# Patient Record
Sex: Female | Born: 1955
Health system: Southern US, Community
[De-identification: ages and names within clinical notes are randomized; demographics above are authoritative.]

## PROBLEM LIST (undated history)

## (undated) DIAGNOSIS — M549 Dorsalgia, unspecified: Secondary | ICD-10-CM

## (undated) DIAGNOSIS — M1990 Unspecified osteoarthritis, unspecified site: Secondary | ICD-10-CM

## (undated) DIAGNOSIS — K8050 Calculus of bile duct without cholangitis or cholecystitis without obstruction: Secondary | ICD-10-CM

## (undated) DIAGNOSIS — K859 Acute pancreatitis without necrosis or infection, unspecified: Secondary | ICD-10-CM

## (undated) DIAGNOSIS — I10 Essential (primary) hypertension: Secondary | ICD-10-CM

## (undated) DIAGNOSIS — E039 Hypothyroidism, unspecified: Secondary | ICD-10-CM

## (undated) HISTORY — PX: APPENDECTOMY: SHX54

## (undated) HISTORY — DX: Dorsalgia, unspecified: M54.9

## (undated) HISTORY — PX: ABDOMINAL HYSTERECTOMY: SHX81

## (undated) HISTORY — PX: TUBAL LIGATION: SHX77

## (undated) HISTORY — PX: HERNIA REPAIR: SHX51

## (undated) HISTORY — DX: Unspecified osteoarthritis, unspecified site: M19.90

## (undated) HISTORY — DX: Hypothyroidism, unspecified: E03.9

## (undated) HISTORY — PX: ERCP: SHX60

## (undated) HISTORY — PX: OTHER SURGICAL HISTORY: SHX169

---

## 2001-04-25 ENCOUNTER — Ambulatory Visit (HOSPITAL_COMMUNITY): Admission: RE | Admit: 2001-04-25 | Discharge: 2001-04-25 | Payer: Self-pay | Admitting: Family Medicine

## 2001-04-25 ENCOUNTER — Encounter: Payer: Self-pay | Admitting: Family Medicine

## 2005-04-30 ENCOUNTER — Emergency Department (HOSPITAL_COMMUNITY): Admission: EM | Admit: 2005-04-30 | Discharge: 2005-04-30 | Payer: Self-pay | Admitting: Emergency Medicine

## 2006-10-10 ENCOUNTER — Ambulatory Visit (HOSPITAL_COMMUNITY): Admission: RE | Admit: 2006-10-10 | Discharge: 2006-10-10 | Payer: Self-pay | Admitting: Family Medicine

## 2009-12-19 ENCOUNTER — Emergency Department (HOSPITAL_COMMUNITY): Admission: EM | Admit: 2009-12-19 | Discharge: 2009-12-19 | Payer: Self-pay | Admitting: Emergency Medicine

## 2010-01-03 ENCOUNTER — Ambulatory Visit (HOSPITAL_COMMUNITY)
Admission: RE | Admit: 2010-01-03 | Discharge: 2010-01-03 | Payer: Self-pay | Source: Home / Self Care | Admitting: Family Medicine

## 2011-01-10 ENCOUNTER — Encounter: Payer: Self-pay | Admitting: Family Medicine

## 2011-01-11 ENCOUNTER — Ambulatory Visit (HOSPITAL_COMMUNITY)
Admission: RE | Admit: 2011-01-11 | Discharge: 2011-01-11 | Payer: Self-pay | Source: Home / Self Care | Attending: Family Medicine | Admitting: Family Medicine

## 2011-01-20 ENCOUNTER — Encounter: Payer: Self-pay | Admitting: Family Medicine

## 2011-06-22 ENCOUNTER — Ambulatory Visit (HOSPITAL_COMMUNITY)
Admission: RE | Admit: 2011-06-22 | Discharge: 2011-06-22 | Disposition: A | Discharge status: Home / Self Care | Payer: BC Managed Care – PPO | Source: Ambulatory Visit | Attending: Family Medicine | Admitting: Family Medicine

## 2011-06-22 ENCOUNTER — Other Ambulatory Visit (HOSPITAL_COMMUNITY): Payer: Self-pay | Admitting: Family Medicine

## 2011-06-22 DIAGNOSIS — M25569 Pain in unspecified knee: Secondary | ICD-10-CM

## 2011-06-22 DIAGNOSIS — IMO0002 Reserved for concepts with insufficient information to code with codable children: Secondary | ICD-10-CM

## 2011-09-17 SURGERY — Surgical Case
Anesthesia: *Unknown

## 2012-03-08 ENCOUNTER — Other Ambulatory Visit (HOSPITAL_COMMUNITY): Payer: Self-pay | Admitting: Family Medicine

## 2012-03-08 DIAGNOSIS — Z139 Encounter for screening, unspecified: Secondary | ICD-10-CM

## 2012-03-10 ENCOUNTER — Ambulatory Visit (HOSPITAL_COMMUNITY)
Admission: RE | Admit: 2012-03-10 | Discharge: 2012-03-10 | Disposition: A | Discharge status: Home / Self Care | Payer: Managed Care, Other (non HMO) | Source: Ambulatory Visit | Attending: Family Medicine | Admitting: Family Medicine

## 2012-03-10 DIAGNOSIS — Z139 Encounter for screening, unspecified: Secondary | ICD-10-CM

## 2012-03-10 DIAGNOSIS — Z1231 Encounter for screening mammogram for malignant neoplasm of breast: Secondary | ICD-10-CM | POA: Insufficient documentation

## 2012-11-19 DIAGNOSIS — K8050 Calculus of bile duct without cholangitis or cholecystitis without obstruction: Secondary | ICD-10-CM

## 2012-11-19 HISTORY — DX: Calculus of bile duct without cholangitis or cholecystitis without obstruction: K80.50

## 2012-11-23 ENCOUNTER — Other Ambulatory Visit (HOSPITAL_COMMUNITY): Payer: Self-pay | Admitting: Family Medicine

## 2012-11-23 DIAGNOSIS — K5900 Constipation, unspecified: Secondary | ICD-10-CM

## 2012-11-23 DIAGNOSIS — R109 Unspecified abdominal pain: Secondary | ICD-10-CM

## 2012-11-28 ENCOUNTER — Ambulatory Visit (HOSPITAL_COMMUNITY): Payer: Managed Care, Other (non HMO)

## 2012-12-01 ENCOUNTER — Ambulatory Visit (HOSPITAL_COMMUNITY)
Admission: RE | Admit: 2012-12-01 | Discharge: 2012-12-01 | Disposition: A | Discharge status: Home / Self Care | Payer: Managed Care, Other (non HMO) | Source: Ambulatory Visit | Attending: Family Medicine | Admitting: Family Medicine

## 2012-12-01 DIAGNOSIS — R109 Unspecified abdominal pain: Secondary | ICD-10-CM | POA: Insufficient documentation

## 2012-12-01 DIAGNOSIS — K5900 Constipation, unspecified: Secondary | ICD-10-CM | POA: Insufficient documentation

## 2012-12-04 ENCOUNTER — Inpatient Hospital Stay (HOSPITAL_COMMUNITY)
Admission: EM | Admit: 2012-12-04 | Discharge: 2012-12-05 | DRG: 440 | Disposition: A | Discharge status: Other Acute Inpatient Hospital | Payer: Managed Care, Other (non HMO) | Attending: Internal Medicine | Admitting: Internal Medicine

## 2012-12-04 ENCOUNTER — Emergency Department (HOSPITAL_COMMUNITY): Payer: Managed Care, Other (non HMO)

## 2012-12-04 ENCOUNTER — Encounter (HOSPITAL_COMMUNITY): Payer: Self-pay

## 2012-12-04 DIAGNOSIS — I10 Essential (primary) hypertension: Secondary | ICD-10-CM | POA: Diagnosis present

## 2012-12-04 DIAGNOSIS — Z6832 Body mass index (BMI) 32.0-32.9, adult: Secondary | ICD-10-CM

## 2012-12-04 DIAGNOSIS — Z23 Encounter for immunization: Secondary | ICD-10-CM

## 2012-12-04 DIAGNOSIS — F17200 Nicotine dependence, unspecified, uncomplicated: Secondary | ICD-10-CM | POA: Diagnosis present

## 2012-12-04 DIAGNOSIS — E669 Obesity, unspecified: Secondary | ICD-10-CM | POA: Diagnosis present

## 2012-12-04 DIAGNOSIS — K8689 Other specified diseases of pancreas: Secondary | ICD-10-CM

## 2012-12-04 DIAGNOSIS — K859 Acute pancreatitis without necrosis or infection, unspecified: Principal | ICD-10-CM | POA: Diagnosis present

## 2012-12-04 HISTORY — DX: Essential (primary) hypertension: I10

## 2012-12-04 LAB — URINALYSIS, ROUTINE W REFLEX MICROSCOPIC
Hgb urine dipstick: NEGATIVE
Protein, ur: NEGATIVE mg/dL
Urobilinogen, UA: 0.2 mg/dL (ref 0.0–1.0)

## 2012-12-04 LAB — COMPREHENSIVE METABOLIC PANEL
Alkaline Phosphatase: 93 U/L (ref 39–117)
BUN: 13 mg/dL (ref 6–23)
CO2: 27 mEq/L (ref 19–32)
Calcium: 10 mg/dL (ref 8.4–10.5)
Chloride: 102 mEq/L (ref 96–112)
GFR calc Af Amer: 80 mL/min — ABNORMAL LOW (ref >90)
Potassium: 3.5 mEq/L (ref 3.5–5.1)
Sodium: 139 mEq/L (ref 135–145)
Total Protein: 7.9 g/dL (ref 6.0–8.3)

## 2012-12-04 LAB — CBC WITH DIFFERENTIAL/PLATELET
Basophils Absolute: 0 10*3/uL (ref 0.0–0.1)
Basophils Relative: 0 % (ref 0–1)
HCT: 44.3 % (ref 36.0–46.0)
Lymphocytes Relative: 29 % (ref 12–46)
MCHC: 34.1 g/dL (ref 30.0–36.0)
Neutro Abs: 6.7 10*3/uL (ref 1.7–7.7)
Neutrophils Relative %: 62 % (ref 43–77)
Platelets: 326 10*3/uL (ref 150–400)
RDW: 14.1 % (ref 11.5–15.5)
WBC: 10.8 10*3/uL — ABNORMAL HIGH (ref 4.0–10.5)

## 2012-12-04 LAB — LIPASE, BLOOD: Lipase: 1325 U/L — ABNORMAL HIGH (ref 11–59)

## 2012-12-04 MED ORDER — SODIUM CHLORIDE 0.9 % IV SOLN
INTRAVENOUS | Status: DC
  Administered 2012-12-04 – 2012-12-05 (×3): via INTRAVENOUS

## 2012-12-04 MED ORDER — MORPHINE SULFATE 2 MG/ML IJ SOLN
2.0000 mg | INTRAMUSCULAR | Status: DC | PRN
  Administered 2012-12-04 – 2012-12-05 (×4): 2 mg via INTRAVENOUS
  Filled 2012-12-04 (×4): qty 1

## 2012-12-04 MED ORDER — FENTANYL CITRATE 0.05 MG/ML IJ SOLN
50.0000 ug | INTRAMUSCULAR | Status: DC | PRN
  Administered 2012-12-04: 50 ug via INTRAVENOUS
  Filled 2012-12-04: qty 2

## 2012-12-04 MED ORDER — ONDANSETRON HCL 4 MG/2ML IJ SOLN
4.0000 mg | INTRAMUSCULAR | Status: DC | PRN
  Administered 2012-12-04: 4 mg via INTRAVENOUS
  Filled 2012-12-04: qty 2

## 2012-12-04 MED ORDER — HEPARIN SODIUM (PORCINE) 5000 UNIT/ML IJ SOLN
5000.0000 [IU] | Freq: Three times a day (TID) | INTRAMUSCULAR | Status: DC
  Administered 2012-12-04 – 2012-12-05 (×4): 5000 [IU] via SUBCUTANEOUS
  Filled 2012-12-04 (×4): qty 1

## 2012-12-04 MED ORDER — METOPROLOL TARTRATE 1 MG/ML IV SOLN: 5.0000 mg | INTRAVENOUS | Status: DC | PRN

## 2012-12-04 MED ORDER — FAMOTIDINE IN NACL 20-0.9 MG/50ML-% IV SOLN
20.0000 mg | Freq: Once | INTRAVENOUS | Status: AC
  Administered 2012-12-04: 20 mg via INTRAVENOUS
  Filled 2012-12-04: qty 50

## 2012-12-04 NOTE — ED Provider Notes (Signed)
History     CSN: [id number]  Arrival date & time 12/04/12  0903   First MD Initiated Contact with Patient 12/04/12 1027      Chief Complaint  Patient presents with  . Abdominal Pain  . Back Pain     HPI Pt was seen at 1040.   Per pt, c/o gradual onset and worsening of constant upper abd "pain" for the past 1 week.  Describes the pain as radiating into her back.  Has been associated with nausea.  Pt states she was eval by her PMD for same yesterday, had an outpt [place] yesterday and was told "something is wrong with my pancreas."  Denies CP/SOB, no cough, no fevers, no vomiting/diarrhea.     Past Medical History  Diagnosis Date  . Hypertension     Past Surgical History  Procedure Date  . Appendectomy   . Hernia repair   . Abdominal hysterectomy   . Tubal ligation     History  Substance Use Topics  . Smoking status: Current Every Day Smoker  . Smokeless tobacco: Not on file  . Alcohol Use: No      Review of Systems ROS: Statement: All systems negative except as marked or noted in the HPI; Constitutional: Negative for fever and chills. ; ; Eyes: Negative for eye pain, redness and discharge. ; ; ENMT: Negative for ear pain, hoarseness, nasal congestion, sinus pressure and sore throat. ; ; Cardiovascular: Negative for chest pain, palpitations, diaphoresis, dyspnea and peripheral edema. ; ; Respiratory: Negative for cough, wheezing and stridor. ; ; Gastrointestinal: +abd pain, nausea. Negative for vomiting, diarrhea, blood in stool, hematemesis, jaundice and rectal bleeding. . ; ; Genitourinary: Negative for dysuria, flank pain and hematuria. ; ; Musculoskeletal: Negative for back pain and neck pain. Negative for swelling and trauma.; ; Skin: Negative for pruritus, rash, abrasions, blisters, bruising and skin lesion.; ; Neuro: Negative for headache, lightheadedness and neck stiffness. Negative for weakness, altered level of consciousness , altered mental status, extremity weakness,  paresthesias, involuntary movement, seizure and syncope.       Allergies  Codeine  Home Medications   Current Outpatient Rx  Name  Route  Sig  Dispense  Refill  . LISINOPRIL-HYDROCHLOROTHIAZIDE 10-12.5 MG PO TABS   Oral   Take 1 tablet by mouth daily.         Marland Kitchen NAPROXEN SODIUM 220 MG PO TABS   Oral   Take 220 mg by mouth 2 (two) times daily as needed. Pain           BP 119/82  Pulse 93  Temp 97.8 F (36.6 C) (Oral)  Resp 17  Ht 5' (1.524 m)  Wt 172 lb (78.019 kg)  BMI 33.59 kg/m2  SpO2 99%  Physical Exam 1045: Physical examination:  Nursing notes reviewed; Vital signs and O2 SAT reviewed;  Constitutional: Well developed, Well nourished, Well hydrated, Uncomfortable appearing; Head:  Normocephalic, atraumatic; Eyes: EOMI, PERRL, No scleral icterus; ENMT: Mouth and pharynx normal, Mucous membranes moist; Neck: Supple, Full range of motion, No lymphadenopathy; Cardiovascular: Regular rate and rhythm, No murmur, rub, or gallop; Respiratory: Breath sounds clear & equal bilaterally, No rales, rhonchi, wheezes.  Speaking full sentences with ease, Normal respiratory effort/excursion; Chest: Nontender, Movement normal; Abdomen: Soft, +RUQ and mid-epigastric areas tender to palp. No rebound or guarding. Nondistended, Normal bowel sounds; Genitourinary: No CVA tenderness; Extremities: Pulses normal, No tenderness, No edema, No calf edema or asymmetry.; Neuro: AA&Ox3, Major CN grossly intact.  Speech clear.  No gross focal motor or sensory deficits in extremities.; Skin: Color normal, Warm, Dry.   ED Course  Procedures   MDM  MDM Reviewed: previous chart, vitals and nursing note Reviewed previous: ultrasound Interpretation: labs and ECG    Date: 12/04/2012  Rate: 73  Rhythm: normal sinus rhythm  QRS Axis: normal  Intervals: normal  ST/T Wave abnormalities: normal  Conduction Disutrbances:none  Narrative Interpretation:   Old EKG Reviewed: none available.   Results  for orders placed during the hospital encounter of 12/04/12  CBC WITH DIFFERENTIAL      Component Value Range   WBC 10.8 (*) 4.0 - 10.5 K/uL   RBC 4.89  3.87 - 5.11 MIL/uL   Hemoglobin 15.1 (*) 12.0 - 15.0 g/dL   HCT 19.1  47.8 - 29.5 %   MCV 90.6  78.0 - 100.0 fL   MCH 30.9  26.0 - 34.0 pg   MCHC 34.1  30.0 - 36.0 g/dL   RDW 62.1  30.8 - 65.7 %   Platelets 326  150 - 400 K/uL   Neutrophils Relative 62  43 - 77 %   Neutro Abs 6.7  1.7 - 7.7 K/uL   Lymphocytes Relative 29  12 - 46 %   Lymphs Abs 3.2  0.7 - 4.0 K/uL   Monocytes Relative 6  3 - 12 %   Monocytes Absolute 0.6  0.1 - 1.0 K/uL   Eosinophils Relative 3  0 - 5 %   Eosinophils Absolute 0.3  0.0 - 0.7 K/uL   Basophils Relative 0  0 - 1 %   Basophils Absolute 0.0  0.0 - 0.1 K/uL  COMPREHENSIVE METABOLIC PANEL      Component Value Range   Sodium 139  135 - 145 mEq/L   Potassium 3.5  3.5 - 5.1 mEq/L   Chloride 102  96 - 112 mEq/L   CO2 27  19 - 32 mEq/L   Glucose, Bld 91  70 - 99 mg/dL   BUN 13  6 - 23 mg/dL   Creatinine, Ser 8.46  0.50 - 1.10 mg/dL   Calcium 96.2  8.4 - 95.2 mg/dL   Total Protein 7.9  6.0 - 8.3 g/dL   Albumin 4.1  3.5 - 5.2 g/dL   AST 16  0 - 37 U/L   ALT 12  0 - 35 U/L   Alkaline Phosphatase 93  39 - 117 U/L   Total Bilirubin 0.2 (*) 0.3 - 1.2 mg/dL   GFR calc non Af Amer 69 (*) >90 mL/min   GFR calc Af Amer 80 (*) >90 mL/min  LIPASE, BLOOD      Component Value Range   Lipase 1325 (*) 11 - 59 U/L  TROPONIN I      Component Value Range   Troponin I <0.30  <0.30 ng/mL  URINALYSIS, ROUTINE W REFLEX MICROSCOPIC      Component Value Range   Color, Urine YELLOW  YELLOW   APPearance CLEAR  CLEAR   Specific Gravity, Urine 1.010  1.005 - 1.030   pH 6.0  5.0 - 8.0   Glucose, UA NEGATIVE  NEGATIVE mg/dL   Hgb urine dipstick NEGATIVE  NEGATIVE   Bilirubin Urine NEGATIVE  NEGATIVE   Ketones, ur NEGATIVE  NEGATIVE mg/dL   Protein, ur NEGATIVE  NEGATIVE mg/dL   Urobilinogen, UA 0.2  0.0 - 1.0 mg/dL    Nitrite NEGATIVE  NEGATIVE   Leukocytes, UA NEGATIVE  NEGATIVE   US Abdomen Complete 12/01/2012  *  RADIOLOGY REPORT*  Clinical Data:  Abdominal pain and constipation.  COMPLETE ABDOMINAL ULTRASOUND  Comparison:  None.  Findings:  Gallbladder:  No gallstones, gallbladder wall thickening, or pericholecystic fluid. Difficult to exclude a small amount of gallbladder sludge.  Common bile duct:  Common bile duct measures 0.3 cm.  Liver:  No focal lesion identified.  Within normal limits in parenchymal echogenicity.  IVC:  Appears normal.  Pancreas:  The pancreatic duct is dilated, measuring up to 5 mm.  Spleen:  Normal appearance of the spleen.  Spleen measures 5.2 cm in length.  Right Kidney:  Normal appearance of the right kidney.  Right kidney measures 10.4 cm in length without hydronephrosis.  Left Kidney:  Left kidney measures 11.5 cm in length without hydronephrosis. There is mild dilatation of the renal pelvis which could be related to an extrarenal pelvis.  Abdominal aorta:  No aneurysm identified. Mild plaque in the distal abdominal aorta.  IMPRESSION: The pancreatic duct appears to be dilated, measuring up to 5 mm. There is no significant biliary dilatation.  These findings are nonspecific but an obstructing lesion or stone cannot be excluded in the pancreatic duct.  The pancreatic duct could be further evaluated with an MRI with MRCP.  These results will be called to the ordering clinician or representative by the Radiologist Assistant, and communication documented in the PACS Dashboard.   Original Report Authenticated By: Richarda Overlie, M.D.       1245:  Dx and testing d/w pt and family.  Questions answered.  Verb understanding, agreeable to admit.  T/C to GI Dr. Darrick Penna, case discussed, including:  HPI, pertinent PM/SHx, VS/PE, dx testing, ED course and treatment:  Agreeable to consult, requests to admit to medicine service.  1300:  T/C to [facility] Dr. Karilyn Cota, case discussed, including:  HPI, pertinent  PM/SHx, VS/PE, dx testing, ED course and treatment:  Agreeable to admit.        Laray Anger, DO 12/06/12 1542

## 2012-12-04 NOTE — H&P (Signed)
[facility] History and Physical  Elizabeth [name] WUJ:[id number] DOB: [birth year] DOA: 12/04/2012  Referring physician: Dr. Clarene Duke, ER physician. PCP: Kirk Ruths, MD   Chief Complaint: Epigastric pain.  HPI: Elizabeth [name] is a 56 y.o. female complains of epigastric pain intermittently for the last 3 weeks radiating to the back associated with nausea. There has been no weight loss, in fact there has been weight gain. There is no diarrhea. There is no fever. Her primary care physician sent her for an ultrasound which showed a dilated pancreatic duct. There apparently no abnormalities of the gallbladder. There were no gallstones seen. She has been found to have a significantly elevated lipase.   Review of Systems:  Apart from history of present illness, other systems negative.  Past Medical History  Diagnosis Date  . Hypertension    Past Surgical History  Procedure Date  . Appendectomy   . Hernia repair   . Abdominal hysterectomy   . Tubal ligation    Social History:  She is divorced and lives with her grandchild. She does not smoke cigarettes. She does not drink alcohol.  Allergies  Allergen Reactions  . Codeine Itching and Other (See Comments)    Restlessness     No family history on file. her sister and mother has both had gallbladder disease.   Prior to Admission medications   Medication Sig Start Date End Date Taking? Authorizing Provider  lisinopril-hydrochlorothiazide (PRINZIDE,ZESTORETIC) 10-12.5 MG per tablet Take 1 tablet by mouth daily.   Yes Historical Provider, MD  naproxen sodium (ALEVE) 220 MG tablet Take 220 mg by mouth 2 (two) times daily as needed. Pain   Yes Historical Provider, MD   Physical Exam: Filed Vitals:   12/04/12 0940  BP: 119/82  Pulse: 93  Temp: 97.8 F (36.6 C)  TempSrc: Oral  Resp: 17  Height: 5' (1.524 m)  Weight: 78.019 kg (172 lb)  SpO2: 99%     General:  She does look systemically well. She is not toxic or  septic.  Eyes: No jaundice. No pallor.  ENT: No abnormalities.  Neck: No lymphadenopathy.  Cardiovascular: Heart sounds are present without gallop rhythm. No murmurs.  Respiratory: Lung fields are clear.  Abdomen: Abdomen is soft and mildly tender in the epigastric area.  Skin: No rashes.  Musculoskeletal: No joint problems.  Psychiatric: Appropriate affect.  Neurologic: Alert and oriented without focal neurological signs.  Labs on Admission:  Basic Metabolic Panel:  Lab 12/04/12 9562  NA 139  K 3.5  CL 102  CO2 27  GLUCOSE 91  BUN 13  CREATININE 0.90  CALCIUM 10.0  MG --  PHOS --   Liver Function Tests:  Lab 12/04/12 1102  AST 16  ALT 12  ALKPHOS 93  BILITOT 0.2*  PROT 7.9  ALBUMIN 4.1    Lab 12/04/12 1102  LIPASE 1325*  AMYLASE --    CBC:  Lab 12/04/12 1102  WBC 10.8*  NEUTROABS 6.7  HGB 15.1*  HCT 44.3  MCV 90.6  PLT 326   Cardiac Enzymes:  Lab 12/04/12 1102  CKTOTAL --  CKMB --  CKMBINDEX --  TROPONINI <0.30      Radiological Exams on Admission: Dg Chest 2 View  12/04/2012  *RADIOLOGY REPORT*  Clinical Data: Upper abdominal pain.  CHEST - 2 VIEW  Comparison: No priors.  Findings: Lung volumes are normal.  No consolidative airspace disease.  No pleural effusions.  Small dense nodule in the right mid lung is most compatible  with a small calcified granuloma.  No other suspicious appearing pulmonary nodules or masses are otherwise noted.  Pulmonary vasculature is normal.  Heart size is normal.  Mediastinal contours are.  Atherosclerosis in the thoracic aorta.  IMPRESSION: 1.  No radiographic evidence of acute cardiopulmonary disease. 2.  Atherosclerosis.   Original Report Authenticated By: Trudie Reed, M.D.       Assessment/Plan   1. Acute pancreatitis, unclear etiology. Dilated pancreatic duct. 2. Hypertension. 3. Obesity. Plan: 1. Admit to medical floor. 2. N.p.o. 3. IV fluids. 4. Analgesia. 5. Gastroenterology  consultation, will likely need ERCP.  Code Status: Full code.   Family Communication: Plan discussed with patient and sister at the bedside.   Disposition Plan: Home in medically stable.  Time spent: 35 minutes.  Wilson Singer [facility] Pager [phone].  If 7PM-7AM, please contact night-coverage [email or web address] Password [email or web address] 12/04/2012, 1:25 PM

## 2012-12-04 NOTE — Plan of Care (Signed)
Problem: Phase I Progression Outcomes Goal: Initial discharge plan identified Outcome: Adequate for Discharge Plan to d/c home with family when medically stable

## 2012-12-04 NOTE — ED Notes (Signed)
Pt reports pain to her mid abdomen that radiates to her back.  Pt reports seeing Dr Phillips Odor last week.  Pt reports having Ultrasound done yesterday.

## 2012-12-05 ENCOUNTER — Observation Stay (HOSPITAL_COMMUNITY): Payer: Managed Care, Other (non HMO)

## 2012-12-05 DIAGNOSIS — K8689 Other specified diseases of pancreas: Secondary | ICD-10-CM

## 2012-12-05 LAB — CBC
Platelets: 305 10*3/uL (ref 150–400)
RBC: 4.49 MIL/uL (ref 3.87–5.11)
RDW: 14.3 % (ref 11.5–15.5)
WBC: 7.3 10*3/uL (ref 4.0–10.5)

## 2012-12-05 LAB — COMPREHENSIVE METABOLIC PANEL
Alkaline Phosphatase: 81 U/L (ref 39–117)
BUN: 13 mg/dL (ref 6–23)
Chloride: 106 mEq/L (ref 96–112)
Creatinine, Ser: 0.91 mg/dL (ref 0.50–1.10)
GFR calc Af Amer: 79 mL/min — ABNORMAL LOW (ref >90)
GFR calc non Af Amer: 68 mL/min — ABNORMAL LOW (ref >90)
Glucose, Bld: 89 mg/dL (ref 70–99)
Potassium: 4.2 mEq/L (ref 3.5–5.1)
Total Bilirubin: 0.2 mg/dL — ABNORMAL LOW (ref 0.3–1.2)

## 2012-12-05 LAB — LIPASE, BLOOD: Lipase: 215 U/L — ABNORMAL HIGH (ref 11–59)

## 2012-12-05 MED ORDER — IOHEXOL 300 MG/ML  SOLN
100.0000 mL | Freq: Once | INTRAMUSCULAR | Status: AC | PRN
  Administered 2012-12-05: 100 mL via INTRAVENOUS

## 2012-12-05 MED ORDER — PANTOPRAZOLE SODIUM 40 MG IV SOLR
40.0000 mg | INTRAVENOUS | Status: DC
  Administered 2012-12-05: 40 mg via INTRAVENOUS
  Filled 2012-12-05: qty 40

## 2012-12-05 NOTE — Progress Notes (Signed)
     Subjective: This lady still has pain, when I saw her this morning she was better but she had received some morphine. Unfortunately, gastroenterology was unable to see her yesterday.           Physical Exam: Blood pressure 124/95, pulse 70, temperature 97.9 F (36.6 C), temperature source Oral, resp. rate 20, height 5' (1.524 m), weight 76.5 kg (168 lb 10.4 oz), SpO2 100.00%. She looks systemically well and is not toxic or septic. Abdomen is soft and still mildly to moderately tender in the epigastric area. Her abdomen is not in acute abdomen. Lung fields clear. She is alert and oriented.   Investigations:     Basic Metabolic Panel:  Basename 12/05/12 0545 12/04/12 1102  NA 139 139  K 4.2 3.5  CL 106 102  CO2 26 27  GLUCOSE 89 91  BUN 13 13  CREATININE 0.91 0.90  CALCIUM 8.9 10.0  MG -- --  PHOS -- --   Liver Function Tests:  [facility] 12/05/12 0545 12/04/12 1102  AST 13 16  ALT 10 12  ALKPHOS 81 93  BILITOT 0.2* 0.2*  PROT 6.8 7.9  ALBUMIN 3.4* 4.1     CBC:  Basename 12/05/12 0545 12/04/12 1102  WBC 7.3 10.8*  NEUTROABS -- 6.7  HGB 13.8 15.1*  HCT 41.6 44.3  MCV 92.7 90.6  PLT 305 326    Dg Chest 2 View  12/04/2012  *RADIOLOGY REPORT*  Clinical Data: Upper abdominal pain.  CHEST - 2 VIEW  Comparison: No priors.  Findings: Lung volumes are normal.  No consolidative airspace disease.  No pleural effusions.  Small dense nodule in the right mid lung is most compatible with a small calcified granuloma.  No other suspicious appearing pulmonary nodules or masses are otherwise noted.  Pulmonary vasculature is normal.  Heart size is normal.  Mediastinal contours are.  Atherosclerosis in the thoracic aorta.  IMPRESSION: 1.  No radiographic evidence of acute cardiopulmonary disease. 2.  Atherosclerosis.   Original Report Authenticated By: Trudie Reed, M.D.       Medications: I have reviewed the patient's current medications.  Impression: 1. Acute  pancreatitis with a dilated pancreatic duct. 2. Hypertension. 3. Obesity.     Plan: 1. Continue with n.p.o. and intravenous fluids. 2. Continue with intravenous opioids. 3. Await gastroenterology opinion for further recommendations. I think she will likely need ERCP.     LOS: 1 day   Wilson Singer Pager [phone]  12/05/2012, 8:52 AM

## 2012-12-05 NOTE — Progress Notes (Signed)
I went over CT films with Dr. Ulyses Southward. There is dense calcification in pancreatic head with dilated pancreatic duct proximally. Pancreatic head is expanded but no definite mass identified. No peripancreatic edema noted. I am concerned that she could have pancreatic neuroendocrine tumor. She could also have acute on chronic pancreatitis however there is no history of pancreatitis. I have talked with Dr. Lanell Matar of [facility]. Patient will be transferred to that facility upon bed availability. She will need EUS and FNA in order to make definite diagnosis as to cause of pancreatitis. Dr. Karilyn Cota is in agreement and so is the patient.

## 2012-12-05 NOTE — Consult Note (Signed)
Reason for Consult: Referring Physician: Hospitalist  Elizabeth [name] is an 56 y.o. female.  HPI: Admitted thru the ED yesterday.  She c/o epigastric pain radiating into her back and radiated into her rt side of her back.  It felt like hot coals were in her back. Her symptoms started 3 weeks ago. Became worse over the weekend. She denies prior episode of this.  Appetite has been good, however she is afraid to eat because she will have pain.No weight loss. BMs are normal. She denies etoh abuse. She is a smoker.  No tattoos.  Noted lipase elevated 1625.  Brother with hx of liver cancer Mother had ovarian cancer. 12/01/2012 US Abdomen:IMPRESSION:  The pancreatic duct appears to be dilated, measuring up to 5 mm.  There is no significant biliary dilatation. These findings are  nonspecific but an obstructing lesion or stone cannot be excluded  in the pancreatic duct. The pancreatic duct could be further  evaluated with an MRI with MRCP.  These results will be called to the ordering clinician or  representative by the Radiologist Assistant, and communication  documented in the PACS Dashboard.  Original Report Authenticated    Past Medical History  Diagnosis Date  . Hypertension     Past Surgical History  Procedure Date  . Appendectomy   . Hernia repair   . Abdominal hysterectomy   . Tubal ligation     No family history on file.  Social History:  reports that she has been smoking.  She does not have any smokeless tobacco history on file. She reports that she does not drink alcohol or use illicit drugs.  Allergies:  Allergies  Allergen Reactions  . Codeine Itching and Other (See Comments)    Restlessness     Medications: I have reviewed the patient's current medications.  Results for orders placed during the hospital encounter of 12/04/12 (from the past 48 hour(s))  CBC WITH DIFFERENTIAL     Status: Abnormal   Collection Time   12/04/12 11:02 AM      Component Value Range  Comment   WBC 10.8 (*) 4.0 - 10.5 K/uL    RBC 4.89  3.87 - 5.11 MIL/uL    Hemoglobin 15.1 (*) 12.0 - 15.0 g/dL    HCT 21.3  08.6 - 57.8 %    MCV 90.6  78.0 - 100.0 fL    MCH 30.9  26.0 - 34.0 pg    MCHC 34.1  30.0 - 36.0 g/dL    RDW 46.9  62.9 - 52.8 %    Platelets 326  150 - 400 K/uL    Neutrophils Relative 62  43 - 77 %    Neutro Abs 6.7  1.7 - 7.7 K/uL    Lymphocytes Relative 29  12 - 46 %    Lymphs Abs 3.2  0.7 - 4.0 K/uL    Monocytes Relative 6  3 - 12 %    Monocytes Absolute 0.6  0.1 - 1.0 K/uL    Eosinophils Relative 3  0 - 5 %    Eosinophils Absolute 0.3  0.0 - 0.7 K/uL    Basophils Relative 0  0 - 1 %    Basophils Absolute 0.0  0.0 - 0.1 K/uL   COMPREHENSIVE METABOLIC PANEL     Status: Abnormal   Collection Time   12/04/12 11:02 AM      Component Value Range Comment   Sodium 139  135 - 145 mEq/L    Potassium 3.5  3.5 - 5.1 mEq/L    Chloride 102  96 - 112 mEq/L    CO2 27  19 - 32 mEq/L    Glucose, Bld 91  70 - 99 mg/dL    BUN 13  6 - 23 mg/dL    Creatinine, Ser 5.78  0.50 - 1.10 mg/dL    Calcium 46.9  8.4 - 10.5 mg/dL    Total Protein 7.9  6.0 - 8.3 g/dL    Albumin 4.1  3.5 - 5.2 g/dL    AST 16  0 - 37 U/L    ALT 12  0 - 35 U/L    Alkaline Phosphatase 93  39 - 117 U/L    Total Bilirubin 0.2 (*) 0.3 - 1.2 mg/dL    GFR calc non Af Amer 69 (*) >90 mL/min    GFR calc Af Amer 80 (*) >90 mL/min   LIPASE, BLOOD     Status: Abnormal   Collection Time   12/04/12 11:02 AM      Component Value Range Comment   Lipase 1325 (*) 11 - 59 U/L   TROPONIN I     Status: Normal   Collection Time   12/04/12 11:02 AM      Component Value Range Comment   Troponin I <0.30  <0.30 ng/mL   URINALYSIS, ROUTINE W REFLEX MICROSCOPIC     Status: Normal   Collection Time   12/04/12 11:10 AM      Component Value Range Comment   Color, Urine YELLOW  YELLOW    APPearance CLEAR  CLEAR    Specific Gravity, Urine 1.010  1.005 - 1.030    pH 6.0  5.0 - 8.0    Glucose, UA NEGATIVE  NEGATIVE  mg/dL    Hgb urine dipstick NEGATIVE  NEGATIVE    Bilirubin Urine NEGATIVE  NEGATIVE    Ketones, ur NEGATIVE  NEGATIVE mg/dL    Protein, ur NEGATIVE  NEGATIVE mg/dL    Urobilinogen, UA 0.2  0.0 - 1.0 mg/dL    Nitrite NEGATIVE  NEGATIVE    Leukocytes, UA NEGATIVE  NEGATIVE MICROSCOPIC NOT DONE ON URINES WITH NEGATIVE PROTEIN, BLOOD, LEUKOCYTES, NITRITE, OR GLUCOSE <1000 mg/dL.  COMPREHENSIVE METABOLIC PANEL     Status: Abnormal   Collection Time   12/05/12  5:45 AM      Component Value Range Comment   Sodium 139  135 - 145 mEq/L    Potassium 4.2  3.5 - 5.1 mEq/L    Chloride 106  96 - 112 mEq/L    CO2 26  19 - 32 mEq/L    Glucose, Bld 89  70 - 99 mg/dL    BUN 13  6 - 23 mg/dL    Creatinine, Ser 6.29  0.50 - 1.10 mg/dL    Calcium 8.9  8.4 - 52.8 mg/dL    Total Protein 6.8  6.0 - 8.3 g/dL    Albumin 3.4 (*) 3.5 - 5.2 g/dL    AST 13  0 - 37 U/L    ALT 10  0 - 35 U/L    Alkaline Phosphatase 81  39 - 117 U/L    Total Bilirubin 0.2 (*) 0.3 - 1.2 mg/dL    GFR calc non Af Amer 68 (*) >90 mL/min    GFR calc Af Amer 79 (*) >90 mL/min   CBC     Status: Normal   Collection Time   12/05/12  5:45 AM      Component Value Range Comment  WBC 7.3  4.0 - 10.5 K/uL    RBC 4.49  3.87 - 5.11 MIL/uL    Hemoglobin 13.8  12.0 - 15.0 g/dL    HCT 16.1  09.6 - 04.5 %    MCV 92.7  78.0 - 100.0 fL    MCH 30.7  26.0 - 34.0 pg    MCHC 33.2  30.0 - 36.0 g/dL    RDW 40.9  81.1 - 91.4 %    Platelets 305  150 - 400 K/uL   LIPASE, BLOOD     Status: Abnormal   Collection Time   12/05/12  5:45 AM      Component Value Range Comment   Lipase 215 (*) 11 - 59 U/L     Dg Chest 2 View  12/04/2012  *RADIOLOGY REPORT*  Clinical Data: Upper abdominal pain.  CHEST - 2 VIEW  Comparison: No priors.  Findings: Lung volumes are normal.  No consolidative airspace disease.  No pleural effusions.  Small dense nodule in the right mid lung is most compatible with a small calcified granuloma.  No other suspicious appearing  pulmonary nodules or masses are otherwise noted.  Pulmonary vasculature is normal.  Heart size is normal.  Mediastinal contours are.  Atherosclerosis in the thoracic aorta.  IMPRESSION: 1.  No radiographic evidence of acute cardiopulmonary disease. 2.  Atherosclerosis.   Original Report Authenticated By: Trudie Reed, M.D.     ROS Blood pressure 124/95, pulse 70, temperature 97.9 F (36.6 C), temperature source Oral, resp. rate 20, height 5' (1.524 m), weight 168 lb 10.4 oz (76.5 kg), SpO2 100.00%. Physical ExamIAlert and oriented. Skin warm and dry. Oral mucosa is moist.   . Sclera anicteric, conjunctivae is pink. Thyroid not enlarged. No cervical lymphadenopathy. Lungs clear. Heart regular rate and rhythm.  Abdomen is soft. Bowel sounds are positive. No hepatomegaly. No abdominal masses felt.  Epigastric tenderness radiating into back..  No edema to lower extremities.       Assessment/Plan: Dilated pancreatic duct. Lesion or stone needs to be ruled out. Will discuss with Dr. Karilyn Cota. MRI abdomen. Agree with NPO status   SETZER,TERRI W 12/05/2012, 8:44 AM    GI attending note Patient interviewed and examined. Patient has acute pancreatitis. Family history is negative and she has no risk factors. Ultrasound is negative for cholelithiasis or choledocholithiasis pancreatic duct is dilated but no pancreatic mass identified. Transaminases are normal. Further evaluation will be undertaken with CT with pancreas protocol. Further recommendations to follow. Unless she is found to have choledocholithiasis she will not need ERCP at may need EUS.

## 2012-12-05 NOTE — Progress Notes (Signed)
UR Chart Review Completed  

## 2012-12-05 NOTE — Progress Notes (Signed)
Gave report to RN at [facility].  Pt is stable and aware of transport.

## 2012-12-06 LAB — URINE CULTURE

## 2012-12-09 NOTE — Discharge Summary (Signed)
Physician Discharge Summary  Elizabeth [name] ZOX:[id number] DOB: Jan 16, 1956 DOA: 12/04/2012  PCP: Kirk Ruths, MD  Admit date: 12/04/2012 Discharge date: 12/09/2012  Patient transferred to [facility] for further investigations and management.    Discharge Diagnoses:  1. Acute pancreatitis with dilated pancreatic duct. 2. Hypertension.   Discharge Condition: Stable on transfer.  Diet recommendation: N.p.o.  Filed Weights   12/04/12 0940 12/04/12 1540  Weight: 78.019 kg (172 lb) 76.5 kg (168 lb 10.4 oz)    History of present illness:  This very pleasant 56 year old lady presents to the hospital with symptoms of epigastric abdominal pain. Please see initial history as outlined below: HPI: Elizabeth [name] is a 56 y.o. female complains of epigastric pain intermittently for the last 3 weeks radiating to the back associated with nausea. There has been no weight loss, in fact there has been weight gain. There is no diarrhea. There is no fever. Her primary care physician sent her for an ultrasound which showed a dilated pancreatic duct. There apparently no abnormalities of the gallbladder. There were no gallstones seen. She has been found to have a significantly elevated lipase.  Hospital Course:  Patient was admitted and started on intravenous fluids and kept n.p.o. Radiology: Workup showed that the patient had a dilated pancreatic duct. She was seen by gastroenterology, Dr. Karilyn Cota, who contacted his colleague at [facility] Dr. Lanell Matar. Dr. Lanell Matar agreed to take the patient for further investigations which included EUS and FNA to make a definitive diagnosis as to the cause of pancreatitis. She was transferred in stable condition for these investigations and management.  Procedures:  None.   Consultations:  Dr Karilyn Cota, gastroenterology.  Discharge Exam: Filed Vitals:   12/04/12 1540 12/05/12 0619 12/05/12 1420 12/05/12 1754  BP: 102/63 124/95 136/79 127/81  Pulse: 63 70  66 66  Temp: 98.1 F (36.7 C) 97.9 F (36.6 C) 98 F (36.7 C) 97.8 F (36.6 C)  TempSrc: Oral Oral Oral Oral  Resp: 20 20 20 20   Height: 5' (1.524 m)     Weight: 76.5 kg (168 lb 10.4 oz)     SpO2: 96% 100% 100% 99%    General: She looks systemically well. She is not toxic or septic. Cardiovascular: Heart sounds are present without gallop rhythm. Respiratory: Lung fields are clear. Abdomen is soft and tender in the epigastric area. There is no hepatomegaly or any other masses.  Discharge Instructions     Medication List     As of 12/09/2012  2:46 PM    ASK your doctor about these medications         ALEVE 220 MG tablet   Generic drug: naproxen sodium   Take 220 mg by mouth 2 (two) times daily as needed. Pain      lisinopril-hydrochlorothiazide 10-12.5 MG per tablet   Commonly known as: PRINZIDE,ZESTORETIC   Take 1 tablet by mouth daily.          The results of significant diagnostics from this hospitalization (including imaging, microbiology, ancillary and laboratory) are listed below for reference.    Significant Diagnostic Studies: Ct Abdomen Pelvis W Wo Contrast  12/05/2012  *RADIOLOGY REPORT*  Clinical Data: Abdominal and back pain for 3 weeks.  Dilated pancreatic duct on ultrasound.  Pancreatitis.  History of appendectomy, partial hysterectomy and hernia repair.  CT ABDOMEN AND PELVIS WITHOUT AND WITH CONTRAST  Technique:  Multidetector CT imaging of the abdomen and pelvis was performed without contrast material in one or both body regions,  followed by contrast material(s) and further sections in one or both body regions.  Contrast: OMNIPAQUE IOHEXOL 300 MG/ML  SOLN  Comparison: Abdominal ultrasound 12/01/2012.  Findings: Mild dependent atelectasis is present in both lung bases. There is no pleural effusion.  There is a large calcification within the main pancreatic duct of the pancreatic head, measuring up to 1.3 cm in diameter.  There is associated pancreatic  ductal dilatation as demonstrated on prior ultrasound.  There is no biliary ductal dilatation.  Postcontrast images show no evidence of pancreatic mass, surrounding inflammatory change or fluid collection. No other pancreatic calcifications are seen.  The gallbladder appears normal.  There is hepatic low density consistent with steatosis.  No focal hepatic abnormalities are identified.  The spleen, adrenal glands and kidneys appear normal.  There is mild aorto iliac atherosclerosis.  No large vessel occlusion or significant stenosis identified.  The stomach, small bowel and colon appear normal.  There is no adnexal mass status post partial hysterectomy.  The bladder appears unremarkable. No significant hernias are seen.  Degenerative changes are present at the lumbosacral junction. There are no worrisome osseous lesions.  IMPRESSION:  1.  The pancreatic ductal dilatation is secondary to prominent calcifications within the main pancreatic duct within the pancreatic head.  This is probably secondary to prior pancreatitis. 2.  No evidence of acute inflammation, pancreatic mass or biliary ductal dilatation. 3.  Aorto iliac atherosclerosis, lumbar spondylosis and hepatic steatosis noted.   Original Report Authenticated By: Carey Bullocks, M.D.    Dg Chest 2 View  12/04/2012  *RADIOLOGY REPORT*  Clinical Data: Upper abdominal pain.  CHEST - 2 VIEW  Comparison: No priors.  Findings: Lung volumes are normal.  No consolidative airspace disease.  No pleural effusions.  Small dense nodule in the right mid lung is most compatible with a small calcified granuloma.  No other suspicious appearing pulmonary nodules or masses are otherwise noted.  Pulmonary vasculature is normal.  Heart size is normal.  Mediastinal contours are.  Atherosclerosis in the thoracic aorta.  IMPRESSION: 1.  No radiographic evidence of acute cardiopulmonary disease. 2.  Atherosclerosis.   Original Report Authenticated By: Trudie Reed, M.D.    US  Abdomen Complete  12/01/2012  *RADIOLOGY REPORT*  Clinical Data:  Abdominal pain and constipation.  COMPLETE ABDOMINAL ULTRASOUND  Comparison:  None.  Findings:  Gallbladder:  No gallstones, gallbladder wall thickening, or pericholecystic fluid. Difficult to exclude a small amount of gallbladder sludge.  Common bile duct:  Common bile duct measures 0.3 cm.  Liver:  No focal lesion identified.  Within normal limits in parenchymal echogenicity.  IVC:  Appears normal.  Pancreas:  The pancreatic duct is dilated, measuring up to 5 mm.  Spleen:  Normal appearance of the spleen.  Spleen measures 5.2 cm in length.  Right Kidney:  Normal appearance of the right kidney.  Right kidney measures 10.4 cm in length without hydronephrosis.  Left Kidney:  Left kidney measures 11.5 cm in length without hydronephrosis. There is mild dilatation of the renal pelvis which could be related to an extrarenal pelvis.  Abdominal aorta:  No aneurysm identified. Mild plaque in the distal abdominal aorta.  IMPRESSION: The pancreatic duct appears to be dilated, measuring up to 5 mm. There is no significant biliary dilatation.  These findings are nonspecific but an obstructing lesion or stone cannot be excluded in the pancreatic duct.  The pancreatic duct could be further evaluated with an MRI with MRCP.  These results will  be called to the ordering clinician or representative by the Radiologist Assistant, and communication documented in the PACS Dashboard.   Original Report Authenticated By: Richarda Overlie, M.D.     Microbiology: Recent Results (from the past 240 hour(s))  URINE CULTURE     Status: Normal   Collection Time   12/04/12 11:10 AM      Component Value Range Status Comment   Specimen Description URINE, CLEAN CATCH   Final    Special Requests NONE   Final    Culture  Setup Time 12/04/2012 22:45   Final    Colony Count 8,000 COLONIES/ML   Final    Culture INSIGNIFICANT GROWTH   Final    Report Status 12/06/2012 FINAL   Final       Labs: Basic Metabolic Panel:  Lab 12/05/12 4098 12/04/12 1102  NA 139 139  K 4.2 3.5  CL 106 102  CO2 26 27  GLUCOSE 89 91  BUN 13 13  CREATININE 0.91 0.90  CALCIUM 8.9 10.0  MG -- --  PHOS -- --   Liver Function Tests:  Lab 12/05/12 0545 12/04/12 1102  AST 13 16  ALT 10 12  ALKPHOS 81 93  BILITOT 0.2* 0.2*  PROT 6.8 7.9  ALBUMIN 3.4* 4.1    Lab 12/05/12 0545 12/04/12 1102  LIPASE 215* 1325*  AMYLASE -- --    CBC:  Lab 12/05/12 0545 12/04/12 1102  WBC 7.3 10.8*  NEUTROABS -- 6.7  HGB 13.8 15.1*  HCT 41.6 44.3  MCV 92.7 90.6  PLT 305 326   Cardiac Enzymes:  Lab 12/04/12 1102  CKTOTAL --  CKMB --  CKMBINDEX --  TROPONINI <0.30        Signed:  Garry Nicolini C  [facility] 12/09/2012, 2:46 PM

## 2012-12-25 ENCOUNTER — Telehealth (INDEPENDENT_AMBULATORY_CARE_PROVIDER_SITE_OTHER): Payer: Self-pay | Admitting: Internal Medicine

## 2012-12-25 NOTE — Telephone Encounter (Signed)
I spoke with patient this afternoon. She is not in any pain at this time.  I did advise her if she had severe rt upper quadrant pain or epigastric pain to go to the ED. If mild-moderate pain: clear liquids. I will order a amylase,lipase and hepatic function. If she needs pain medication I will give her hydrocodone. I discussed this with Dr. Karilyn Cota.

## 2013-01-09 ENCOUNTER — Other Ambulatory Visit (INDEPENDENT_AMBULATORY_CARE_PROVIDER_SITE_OTHER): Payer: Self-pay | Admitting: *Deleted

## 2013-01-09 ENCOUNTER — Telehealth (INDEPENDENT_AMBULATORY_CARE_PROVIDER_SITE_OTHER): Payer: Self-pay | Admitting: *Deleted

## 2013-01-09 DIAGNOSIS — Z1211 Encounter for screening for malignant neoplasm of colon: Secondary | ICD-10-CM

## 2013-01-09 NOTE — Telephone Encounter (Signed)
  Procedure: tcs  Reason/Indication:  screening  Has patient had this procedure before?  no  If so, when, by whom and where?    Is there a family history of colon cancer?  no  Who?  What age when diagnosed?    Is patient diabetic?   no      Does patient have prosthetic heart valve?  no  Do you have a pacemaker?  no  Has patient had joint replacement within last 12 months?  no  Is patient on Coumadin, Plavix and/or Aspirin? no  Medications: see EPIC  Allergies: see EPIC  Medication Adjustment:   Procedure date & time: 01/31/13 at 100

## 2013-01-10 MED ORDER — PEG-KCL-NACL-NASULF-NA ASC-C 100 G PO SOLR: 1.0000 | Freq: Once | ORAL | Status: DC

## 2013-01-10 NOTE — Telephone Encounter (Signed)
agree

## 2013-01-16 ENCOUNTER — Encounter (INDEPENDENT_AMBULATORY_CARE_PROVIDER_SITE_OTHER): Payer: Self-pay

## 2013-01-31 ENCOUNTER — Ambulatory Visit (HOSPITAL_COMMUNITY)
Admission: RE | Admit: 2013-01-31 | Discharge: 2013-01-31 | Disposition: A | Discharge status: Home / Self Care | Payer: Managed Care, Other (non HMO) | Source: Ambulatory Visit | Attending: Internal Medicine | Admitting: Internal Medicine

## 2013-01-31 ENCOUNTER — Encounter (HOSPITAL_COMMUNITY): Payer: Self-pay | Admitting: *Deleted

## 2013-01-31 ENCOUNTER — Encounter (HOSPITAL_COMMUNITY)
Admission: RE | Disposition: A | Discharge status: Home / Self Care | Payer: Self-pay | Source: Ambulatory Visit | Attending: Internal Medicine

## 2013-01-31 DIAGNOSIS — Z1211 Encounter for screening for malignant neoplasm of colon: Secondary | ICD-10-CM | POA: Insufficient documentation

## 2013-01-31 DIAGNOSIS — D175 Benign lipomatous neoplasm of intra-abdominal organs: Secondary | ICD-10-CM | POA: Insufficient documentation

## 2013-01-31 DIAGNOSIS — I10 Essential (primary) hypertension: Secondary | ICD-10-CM | POA: Insufficient documentation

## 2013-01-31 DIAGNOSIS — K6389 Other specified diseases of intestine: Secondary | ICD-10-CM

## 2013-01-31 HISTORY — DX: Calculus of bile duct without cholangitis or cholecystitis without obstruction: K80.50

## 2013-01-31 HISTORY — PX: COLONOSCOPY: SHX5424

## 2013-01-31 HISTORY — DX: Acute pancreatitis without necrosis or infection, unspecified: K85.90

## 2013-01-31 SURGERY — COLONOSCOPY
Anesthesia: Moderate Sedation

## 2013-01-31 MED ORDER — MIDAZOLAM HCL 5 MG/5ML IJ SOLN
INTRAMUSCULAR | Status: AC
  Filled 2013-01-31: qty 10

## 2013-01-31 MED ORDER — STERILE WATER FOR IRRIGATION IR SOLN
Status: DC | PRN
  Administered 2013-01-31: 13:00:00

## 2013-01-31 MED ORDER — MIDAZOLAM HCL 5 MG/5ML IJ SOLN
INTRAMUSCULAR | Status: DC | PRN
  Administered 2013-01-31: 2 mg via INTRAVENOUS
  Administered 2013-01-31 (×2): 1 mg via INTRAVENOUS
  Administered 2013-01-31: 2 mg via INTRAVENOUS

## 2013-01-31 MED ORDER — MEPERIDINE HCL 50 MG/ML IJ SOLN
INTRAMUSCULAR | Status: AC
  Filled 2013-01-31: qty 1

## 2013-01-31 MED ORDER — SODIUM CHLORIDE 0.45 % IV SOLN
INTRAVENOUS | Status: DC
  Administered 2013-01-31: 13:00:00 via INTRAVENOUS

## 2013-01-31 MED ORDER — MEPERIDINE HCL 50 MG/ML IJ SOLN
INTRAMUSCULAR | Status: DC | PRN
  Administered 2013-01-31 (×2): 25 mg via INTRAVENOUS

## 2013-01-31 NOTE — Op Note (Signed)
COLONOSCOPY PROCEDURE REPORT  PATIENT:  Elizabeth [name]  MR#:  [id number] Birthdate:  [birth year], 57 y.o., female Endoscopist:  Dr. Malissa Hippo, MD Referred By:  Dr. Kirk Ruths, MD Procedure Date: 01/31/2013  Procedure:   Colonoscopy  Indications:  Patient is 57 year-old Caucasian female was undergoing average risk screening colonoscopy.  Informed Consent:  The procedure and risks were reviewed with the patient and informed consent was obtained.  Medications:  Demerol 50 mg IV Versed  6 mg IV  Description of procedure:  After a digital rectal exam was performed, that colonoscope was advanced from the anus through the rectum and colon to the area of the cecum, ileocecal valve and appendiceal orifice. The cecum was deeply intubated. These structures were well-seen and photographed for the record. From the level of the cecum and ileocecal valve, the scope was slowly and cautiously withdrawn. The mucosal surfaces were carefully surveyed utilizing scope tip to flexion to facilitate fold flattening as needed. The scope was pulled down into the rectum where a thorough exam including retroflexion was performed.  Findings:   Prep excellent. 5 mm submucosal lipoma at hepatic flexure. No other mucosal abnormalities or polyps noted. Normal rectal mucosa and anal rectal junction.  Therapeutic/Diagnostic Maneuvers Performed:  None  Complications:  None  Cecal Withdrawal Time:  17 minutes  Impression:  Small submucosal lipoma at hepatic flexure otherwise normal colonoscopy.  Recommendations:  Standard instructions given. Next screening exam in 10 years.  Darris Staiger U  01/31/2013 2:05 PM  CC: Dr. Kirk Ruths, MD & Dr. Bonnetta Barry ref. provider found

## 2013-01-31 NOTE — H&P (Signed)
Elizabeth [name] is an 57 y.o. female.   Chief Complaint: Patient is here for colonoscopy. HPI: Patient is 57 year old Caucasian female who is here for screening colonoscopy. She denies abdominal pain change in bowel habits or rectal bleeding. Family history is negative for colorectal carcinoma. This is patient's first exam.  Past Medical History  Diagnosis Date  . Hypertension   . Pancreatitis due to common bile duct stone    pancreatitis secondary to stone in pancreatic duct and not and bile duct as above Past Surgical History  Procedure Laterality Date  . Appendectomy    . Hernia repair    . Abdominal hysterectomy    . Tubal ligation    . Ercp    . Right rotator cuff repair      Family History  Problem Relation Age of Onset  . Colon cancer Neg Hx    Social History:  reports that she quit smoking about 7 weeks ago. Her smoking use included Cigarettes. She has a 82 pack-year smoking history. She does not have any smokeless tobacco history on file. She reports that she does not drink alcohol or use illicit drugs.  Allergies:  Allergies  Allergen Reactions  . Codeine Itching and Other (See Comments)    Restlessness     Medications Prior to Admission  Medication Sig Dispense Refill  . acetaminophen (TYLENOL) 500 MG tablet Take 500 mg by mouth every 6 (six) hours as needed. Pain.      Marland Kitchen lisinopril-hydrochlorothiazide (PRINZIDE,ZESTORETIC) 10-12.5 MG per tablet Take 1 tablet by mouth daily.      . peg 3350 powder (MOVIPREP) 100 G SOLR Take 1 kit (100 g total) by mouth once.  1 kit  0  . naproxen sodium (ALEVE) 220 MG tablet Take 220 mg by mouth 2 (two) times daily as needed. Pain        No results found for this or any previous visit (from the past 48 hour(s)). No results found.  ROS  Blood pressure 144/86, pulse 63, temperature 97.7 F (36.5 C), temperature source Oral, resp. rate 18, height 5' (1.524 m), weight 164 lb (74.39 kg), SpO2 100.00%. Physical Exam  Constitutional:  She appears well-developed and well-nourished.  HENT:  Mouth/Throat: Oropharynx is clear and moist.  Eyes: Conjunctivae are normal. No scleral icterus.  Neck: No thyromegaly present.  Cardiovascular: Normal rate, regular rhythm and normal heart sounds.   No murmur heard. Respiratory: Effort normal.  GI: Soft. She exhibits no distension and no mass. There is no tenderness.  Lymphadenopathy:    She has no cervical adenopathy.  Neurological: She is alert.  Skin: Skin is warm and dry.     Assessment/Plan Average risk screening colonoscopy.  Vicktoria Muckey U 01/31/2013, 1:27 PM

## 2013-02-05 ENCOUNTER — Encounter (HOSPITAL_COMMUNITY): Payer: Self-pay | Admitting: Internal Medicine

## 2013-04-23 ENCOUNTER — Other Ambulatory Visit (HOSPITAL_COMMUNITY): Payer: Self-pay | Admitting: Family Medicine

## 2013-04-23 DIAGNOSIS — Z139 Encounter for screening, unspecified: Secondary | ICD-10-CM

## 2013-04-27 ENCOUNTER — Ambulatory Visit (HOSPITAL_COMMUNITY)
Admission: RE | Admit: 2013-04-27 | Discharge: 2013-04-27 | Disposition: A | Discharge status: Home / Self Care | Payer: Managed Care, Other (non HMO) | Source: Ambulatory Visit | Attending: Family Medicine | Admitting: Family Medicine

## 2013-04-27 DIAGNOSIS — Z139 Encounter for screening, unspecified: Secondary | ICD-10-CM

## 2013-04-27 DIAGNOSIS — Z1231 Encounter for screening mammogram for malignant neoplasm of breast: Secondary | ICD-10-CM | POA: Insufficient documentation

## 2013-06-06 ENCOUNTER — Other Ambulatory Visit (HOSPITAL_COMMUNITY): Payer: Self-pay | Admitting: Internal Medicine

## 2013-06-06 DIAGNOSIS — R319 Hematuria, unspecified: Secondary | ICD-10-CM

## 2013-06-06 DIAGNOSIS — R1013 Epigastric pain: Secondary | ICD-10-CM

## 2013-06-08 ENCOUNTER — Encounter (HOSPITAL_COMMUNITY): Payer: Self-pay | Admitting: *Deleted

## 2013-06-08 ENCOUNTER — Ambulatory Visit (HOSPITAL_COMMUNITY): Admission: RE | Admit: 2013-06-08 | Payer: Managed Care, Other (non HMO) | Source: Ambulatory Visit

## 2013-06-08 ENCOUNTER — Emergency Department (HOSPITAL_COMMUNITY): Payer: Managed Care, Other (non HMO)

## 2013-06-08 ENCOUNTER — Emergency Department (HOSPITAL_COMMUNITY)
Admission: EM | Admit: 2013-06-08 | Discharge: 2013-06-08 | Disposition: A | Discharge status: Home / Self Care | Payer: Managed Care, Other (non HMO) | Attending: Emergency Medicine | Admitting: Emergency Medicine

## 2013-06-08 DIAGNOSIS — R509 Fever, unspecified: Secondary | ICD-10-CM | POA: Insufficient documentation

## 2013-06-08 DIAGNOSIS — Z87891 Personal history of nicotine dependence: Secondary | ICD-10-CM | POA: Insufficient documentation

## 2013-06-08 DIAGNOSIS — E039 Hypothyroidism, unspecified: Secondary | ICD-10-CM | POA: Insufficient documentation

## 2013-06-08 DIAGNOSIS — I10 Essential (primary) hypertension: Secondary | ICD-10-CM | POA: Insufficient documentation

## 2013-06-08 DIAGNOSIS — Z8719 Personal history of other diseases of the digestive system: Secondary | ICD-10-CM | POA: Insufficient documentation

## 2013-06-08 DIAGNOSIS — Z79899 Other long term (current) drug therapy: Secondary | ICD-10-CM | POA: Insufficient documentation

## 2013-06-08 DIAGNOSIS — R300 Dysuria: Secondary | ICD-10-CM | POA: Insufficient documentation

## 2013-06-08 DIAGNOSIS — R110 Nausea: Secondary | ICD-10-CM | POA: Insufficient documentation

## 2013-06-08 DIAGNOSIS — R0602 Shortness of breath: Secondary | ICD-10-CM | POA: Insufficient documentation

## 2013-06-08 DIAGNOSIS — R51 Headache: Secondary | ICD-10-CM | POA: Insufficient documentation

## 2013-06-08 DIAGNOSIS — M2450 Contracture, unspecified joint: Secondary | ICD-10-CM | POA: Insufficient documentation

## 2013-06-08 LAB — URINE MICROSCOPIC-ADD ON

## 2013-06-08 LAB — URINALYSIS, ROUTINE W REFLEX MICROSCOPIC
Bilirubin Urine: NEGATIVE
Ketones, ur: NEGATIVE mg/dL
Leukocytes, UA: NEGATIVE
Nitrite: NEGATIVE
Urobilinogen, UA: 0.2 mg/dL (ref 0.0–1.0)
pH: 6 (ref 5.0–8.0)

## 2013-06-08 LAB — HEPATIC FUNCTION PANEL
ALT: 60 U/L — ABNORMAL HIGH (ref 0–35)
Albumin: 3.7 g/dL (ref 3.5–5.2)
Alkaline Phosphatase: 234 U/L — ABNORMAL HIGH (ref 39–117)
Total Protein: 7.8 g/dL (ref 6.0–8.3)

## 2013-06-08 LAB — CBC WITH DIFFERENTIAL/PLATELET
Eosinophils Absolute: 0.1 10*3/uL (ref 0.0–0.7)
Hemoglobin: 13.8 g/dL (ref 12.0–15.0)
Lymphs Abs: 5 10*3/uL — ABNORMAL HIGH (ref 0.7–4.0)
Monocytes Relative: 14 % — ABNORMAL HIGH (ref 3–12)
Neutro Abs: 0.7 10*3/uL — ABNORMAL LOW (ref 1.7–7.7)
Neutrophils Relative %: 10 % — ABNORMAL LOW (ref 43–77)
Platelets: 194 10*3/uL (ref 150–400)
RBC: 4.51 MIL/uL (ref 3.87–5.11)
WBC: 7.1 10*3/uL (ref 4.0–10.5)

## 2013-06-08 LAB — BASIC METABOLIC PANEL
CO2: 25 mEq/L (ref 19–32)
Calcium: 9.2 mg/dL (ref 8.4–10.5)
GFR calc non Af Amer: 39 mL/min — ABNORMAL LOW (ref >90)
Glucose, Bld: 93 mg/dL (ref 70–99)
Potassium: 3.6 mEq/L (ref 3.5–5.1)
Sodium: 141 mEq/L (ref 135–145)

## 2013-06-08 LAB — TROPONIN I: Troponin I: <0.3 ng/mL (ref <0.30)

## 2013-06-08 LAB — PRO B NATRIURETIC PEPTIDE: Pro B Natriuretic peptide (BNP): 133.7 pg/mL — ABNORMAL HIGH (ref 0–125)

## 2013-06-08 LAB — PROTIME-INR: Prothrombin Time: 13.1 seconds (ref 11.6–15.2)

## 2013-06-08 MED ORDER — IOHEXOL 300 MG/ML  SOLN
50.0000 mL | Freq: Once | INTRAMUSCULAR | Status: AC | PRN
  Administered 2013-06-08: 50 mL via ORAL

## 2013-06-08 MED ORDER — IOHEXOL 300 MG/ML  SOLN
100.0000 mL | Freq: Once | INTRAMUSCULAR | Status: AC | PRN
  Administered 2013-06-08: 100 mL via INTRAVENOUS

## 2013-06-08 MED ORDER — SODIUM CHLORIDE 0.9 % IV SOLN
INTRAVENOUS | Status: DC
  Administered 2013-06-08: 10:00:00 via INTRAVENOUS

## 2013-06-08 NOTE — ED Notes (Signed)
Patient is resting comfortably. 

## 2013-06-08 NOTE — ED Provider Notes (Signed)
History    This chart was scribed for Elizabeth Jakes, MD by Leone Payor, ED Scribe. This patient was seen in room APA08/APA08 and the patient's care was started 9:04 AM.   CSN: [id number]  Arrival date & time 06/08/13  0844   First MD Initiated Contact with Patient 06/08/13 [phone]      Chief Complaint  Patient presents with  . Fatigue     The history is provided by the patient. No language interpreter was used.    HPI Comments: Elizabeth [name] is a 57 y.o. female who presents to the Emergency Department complaining of constant, gradually worsening fatigue for the past 2-3 days. Pt was seen by Dr. Sherwood Gambler on 06/06/13 and states she received a call about "abnormal blood count, thyroid, kidneys, and liver." States she is scheduled for an abdominal CT but is unsure why since she denies abdominal pain. States she has been slower to perform her normal, daily activities. She was diagnosed with pancreatitis before Christmas 2013. A stone was found in her pancreatic duct but it was not removed. She has been having regular blood tests with [facility].  States she tastes and smells metal and has been sensitive to certain smells. She has noticed some blood in her urine when she was last seen at Urgent Care; was prescribed cipro. After that she was seen by Dr. Sherwood Gambler and she was directed to discontinue the cipro and all other medications.   Past Medical History  Diagnosis Date  . Hypertension   . Pancreatitis due to common bile duct stone     Past Surgical History  Procedure Laterality Date  . Appendectomy    . Hernia repair    . Abdominal hysterectomy    . Tubal ligation    . Ercp    . Right rotator cuff repair    . Colonoscopy N/A 01/31/2013    Procedure: COLONOSCOPY;  Surgeon: Malissa Hippo, MD;  Location: [facility];  Service: Endoscopy;  Laterality: N/A;  100    Family History  Problem Relation Age of Onset  . Colon cancer Neg Hx     History  Substance Use Topics  .  Smoking status: Former Smoker -- 2.00 packs/day for 41 years    Types: Cigarettes    Quit date: 12/11/2012  . Smokeless tobacco: Not on file  . Alcohol Use: No    OB History   Grav Para Term Preterm Abortions TAB SAB Ect Mult Living                  Review of Systems  Constitutional: Positive for fever, chills and fatigue.  HENT: Negative for sore throat, rhinorrhea and neck pain.   Eyes: Negative for visual disturbance.  Respiratory: Positive for shortness of breath. Negative for cough.   Cardiovascular: Negative for chest pain.  Gastrointestinal: Positive for nausea. Negative for vomiting and abdominal pain.  Genitourinary: Positive for difficulty urinating. Negative for dysuria and hematuria.  Musculoskeletal: Positive for joint swelling. Negative for myalgias and back pain.  Skin: Negative for rash.  Neurological: Negative for dizziness, light-headedness and headaches.  Hematological: Does not bruise/bleed easily.    Allergies  Codeine  Home Medications   Current Outpatient Rx  Name  Route  Sig  Dispense  Refill  . acetaminophen (TYLENOL) 500 MG tablet   Oral   Take 500 mg by mouth every 6 (six) hours as needed. Pain.         . Ciprofloxacin (CIPRO PO)  Oral   Take 1 tablet by mouth 2 (two) times daily.         Marland Kitchen ibuprofen (ADVIL,MOTRIN) 200 MG tablet   Oral   Take 400 mg by mouth every 6 (six) hours as needed for pain or headache.           BP 109/69  Pulse 75  Temp(Src) 98.3 F (36.8 C) (Oral)  Resp 15  Ht 5' (1.524 m)  Wt 178 lb (80.74 kg)  BMI 34.76 kg/m2  SpO2 98%  Physical Exam  Nursing note and vitals reviewed. Constitutional: She is oriented to person, place, and time. She appears well-developed and well-nourished. No distress.  HENT:  Head: Normocephalic and atraumatic.  Mouth/Throat: Uvula is midline and oropharynx is clear and moist.  Eyes: EOM are normal.  Neck: Neck supple. No tracheal deviation present. No thyromegaly present.   Cardiovascular: Normal rate, regular rhythm and normal heart sounds.   No murmur heard. Pulmonary/Chest: Effort normal and breath sounds normal. No respiratory distress. She has no wheezes. She has no rales.  Abdominal: Soft. Bowel sounds are normal. There is no tenderness.  Musculoskeletal: Normal range of motion.  Cap refill on both great toes is 1 sec.   Lymphadenopathy:    She has no cervical adenopathy.  Neurological: She is alert and oriented to person, place, and time.  Skin: Skin is warm and dry.  Psychiatric: She has a normal mood and affect. Her behavior is normal.    ED Course  Procedures (including critical care time)  DIAGNOSTIC STUDIES: Oxygen Saturation is 100% on RA, normal by my interpretation.    COORDINATION OF CARE: 9:19 AM Discussed treatment plan with pt at bedside and pt agreed to plan.   Results for orders placed during the hospital encounter of 06/08/13  CBC WITH DIFFERENTIAL      Result Value Range   WBC 7.1  4.0 - 10.5 K/uL   RBC 4.51  3.87 - 5.11 MIL/uL   Hemoglobin 13.8  12.0 - 15.0 g/dL   HCT 16.1  09.6 - 04.5 %   MCV 90.0  78.0 - 100.0 fL   MCH 30.6  26.0 - 34.0 pg   MCHC 34.0  30.0 - 36.0 g/dL   RDW 40.9 (*) 81.1 - 91.4 %   Platelets 194  150 - 400 K/uL   Neutrophils Relative % 10 (*) 43 - 77 %   Neutro Abs 0.7 (*) 1.7 - 7.7 K/uL   Lymphocytes Relative 70 (*) 12 - 46 %   Lymphs Abs 5.0 (*) 0.7 - 4.0 K/uL   Monocytes Relative 14 (*) 3 - 12 %   Monocytes Absolute 1.0  0.1 - 1.0 K/uL   Eosinophils Relative 2  0 - 5 %   Eosinophils Absolute 0.1  0.0 - 0.7 K/uL   Basophils Relative 4 (*) 0 - 1 %   Basophils Absolute 0.3 (*) 0.0 - 0.1 K/uL   WBC Morphology ATYPICAL LYMPHOCYTES    BASIC METABOLIC PANEL      Result Value Range   Sodium 141  135 - 145 mEq/L   Potassium 3.6  3.5 - 5.1 mEq/L   Chloride 106  96 - 112 mEq/L   CO2 25  19 - 32 mEq/L   Glucose, Bld 93  70 - 99 mg/dL   BUN 20  6 - 23 mg/dL   Creatinine, Ser 7.82 (*) 0.50 - 1.10  mg/dL   Calcium 9.2  8.4 - 95.6 mg/dL   GFR  calc non Af Amer 39 (*) >90 mL/min   GFR calc Af Amer 45 (*) >90 mL/min  HEPATIC FUNCTION PANEL      Result Value Range   Total Protein 7.8  6.0 - 8.3 g/dL   Albumin 3.7  3.5 - 5.2 g/dL   AST 57 (*) 0 - 37 U/L   ALT 60 (*) 0 - 35 U/L   Alkaline Phosphatase 234 (*) 39 - 117 U/L   Total Bilirubin 0.3  0.3 - 1.2 mg/dL   Bilirubin, Direct <2.1  0.0 - 0.3 mg/dL   Indirect Bilirubin NOT CALCULATED  0.3 - 0.9 mg/dL  PROTIME-INR      Result Value Range   Prothrombin Time 13.1  11.6 - 15.2 seconds   INR 1.00  0.00 - 1.49  TROPONIN I      Result Value Range   Troponin I <0.30  <0.30 ng/mL  PRO B NATRIURETIC PEPTIDE      Result Value Range   Pro B Natriuretic peptide (BNP) 133.7 (*) 0 - 125 pg/mL  URINALYSIS, ROUTINE W REFLEX MICROSCOPIC      Result Value Range   Color, Urine YELLOW  YELLOW   APPearance CLEAR  CLEAR   Specific Gravity, Urine 1.020  1.005 - 1.030   pH 6.0  5.0 - 8.0   Glucose, UA NEGATIVE  NEGATIVE mg/dL   Hgb urine dipstick TRACE (*) NEGATIVE   Bilirubin Urine NEGATIVE  NEGATIVE   Ketones, ur NEGATIVE  NEGATIVE mg/dL   Protein, ur 30 (*) NEGATIVE mg/dL   Urobilinogen, UA 0.2  0.0 - 1.0 mg/dL   Nitrite NEGATIVE  NEGATIVE   Leukocytes, UA NEGATIVE  NEGATIVE  URINE MICROSCOPIC-ADD ON      Result Value Range   Squamous Epithelial / LPF MANY (*) RARE   WBC, UA 3-6  <3 WBC/hpf   RBC / HPF 0-2  <3 RBC/hpf   Bacteria, UA FEW (*) RARE   Casts HYALINE CASTS (*) NEGATIVE   Dg Chest 2 View  06/08/2013   *RADIOLOGY REPORT*  Clinical Data: Fatigue  CHEST - 2 VIEW  Comparison: Prior chest x-ray 12/04/2012  Findings: Slightly increased linear opacities in the bilateral bases likely subsegmental atelectasis.  Stable calcified granuloma in the periphery of the right lung.  Cardiac and mediastinal contours are unchanged.  Redemonstration of atherosclerotic vascular calcification in the transverse aorta.  No focal airspace consolidation,  pulmonary edema, effusion or pneumothorax. No acute osseous abnormality.  Soft tissue anchor in the right humeral head suggest prior rotator cuff repair.  IMPRESSION: Mild bibasilar subsegmental atelectasis.  Otherwise, no acute cardiopulmonary disease and no significant interval change compared to prior.   Original Report Authenticated By: Malachy Moan, M.D.   Ct Head Wo Contrast  06/08/2013   *RADIOLOGY REPORT*  Clinical Data: Fatigue  CT HEAD WITHOUT CONTRAST  Technique:  Contiguous axial images were obtained from the base of the skull through the vertex without contrast.  Comparison: None  Findings: There is a bubbly air fluid level posterior aspect left maxillary sinus suspicious for sinusitis.  No skull fracture.  No intracranial hemorrhage, mass effect or midline shift.  No hydrocephalus.  The gray and white matter differentiation is preserved.  No acute infarction.  No mass lesion is noted on this unenhanced scan.  IMPRESSION: No acute intracranial abnormality.  Bubbly air fluid level posterior aspect of the left maxillary sinus suspicious for sinusitis.   Original Report Authenticated By: Natasha Mead, M.D.   Ct Abdomen Pelvis  W Contrast  06/08/2013   *RADIOLOGY REPORT*  Clinical Data: Fatigue and abnormal liver function tests.  History of pancreatitis.  CT ABDOMEN AND PELVIS WITH CONTRAST  Technique:  Multidetector CT imaging of the abdomen and pelvis was performed following the standard protocol during bolus administration of intravenous contrast.  Contrast: 50mL OMNIPAQUE IOHEXOL 300 MG/ML  SOLN, OMNIPAQUE IOHEXOL 300 MG/ML  SOLN  Comparison: 12/05/2012  Findings: Large calculi are again identified in the proximal aspect of the pancreatic duct at the level of the pancreatic head.  There is some associated progression of associated atrophy of the distal pancreas and persistent dilatation of the pancreatic duct.  No pancreatic mass or evidence of pancreatitis is seen by CT.  The liver shows  stable mild steatosis.  No focal mass or evidence of biliary ductal dilatation.  The gallbladder is unremarkable. The spleen, adrenal glands and kidneys are within normal limits.  Bowel loops are normal in caliber and show no evidence of obstruction or inflammation.  No free air or free fluid is visualized.  No focal abscess is identified.  There is no evidence of hernia, mass or enlarged lymph nodes.  Bladder is decompressed. Stable degenerative disc disease is present at L5-S1 with associated vacuum disc and significant disc space narrowing.  IMPRESSION: No acute findings by CT.  Large calculi are again identified within the pancreatic duct.  There is some progression of atrophy of the distal pancreas compared to the prior study.   Original Report Authenticated By: Irish Lack, M.D.      Labs Reviewed  CBC WITH DIFFERENTIAL - Abnormal; Notable for the following:    RDW 16.0 (*)    Neutrophils Relative % 10 (*)    Neutro Abs 0.7 (*)    Lymphocytes Relative 70 (*)    Lymphs Abs 5.0 (*)    Monocytes Relative 14 (*)    Basophils Relative 4 (*)    Basophils Absolute 0.3 (*)    All other components within normal limits  BASIC METABOLIC PANEL - Abnormal; Notable for the following:    Creatinine, Ser 1.46 (*)    GFR calc non Af Amer 39 (*)    GFR calc Af Amer 45 (*)    All other components within normal limits  HEPATIC FUNCTION PANEL - Abnormal; Notable for the following:    AST 57 (*)    ALT 60 (*)    Alkaline Phosphatase 234 (*)    All other components within normal limits  PRO B NATRIURETIC PEPTIDE - Abnormal; Notable for the following:    Pro B Natriuretic peptide (BNP) 133.7 (*)    All other components within normal limits  URINALYSIS, ROUTINE W REFLEX MICROSCOPIC - Abnormal; Notable for the following:    Hgb urine dipstick TRACE (*)    Protein, ur 30 (*)    All other components within normal limits  URINE MICROSCOPIC-ADD ON - Abnormal; Notable for the following:    Squamous  Epithelial / LPF MANY (*)    Bacteria, UA FEW (*)    Casts HYALINE CASTS (*)    All other components within normal limits  URINE CULTURE  PROTIME-INR  TROPONIN I  PATHOLOGIST SMEAR REVIEW   Dg Chest 2 View  06/08/2013   *RADIOLOGY REPORT*  Clinical Data: Fatigue  CHEST - 2 VIEW  Comparison: Prior chest x-ray 12/04/2012  Findings: Slightly increased linear opacities in the bilateral bases likely subsegmental atelectasis.  Stable calcified granuloma in the periphery of the right lung.  Cardiac and mediastinal contours are unchanged.  Redemonstration of atherosclerotic vascular calcification in the transverse aorta.  No focal airspace consolidation, pulmonary edema, effusion or pneumothorax. No acute osseous abnormality.  Soft tissue anchor in the right humeral head suggest prior rotator cuff repair.  IMPRESSION: Mild bibasilar subsegmental atelectasis.  Otherwise, no acute cardiopulmonary disease and no significant interval change compared to prior.   Original Report Authenticated By: Malachy Moan, M.D.   Ct Head Wo Contrast  06/08/2013   *RADIOLOGY REPORT*  Clinical Data: Fatigue  CT HEAD WITHOUT CONTRAST  Technique:  Contiguous axial images were obtained from the base of the skull through the vertex without contrast.  Comparison: None  Findings: There is a bubbly air fluid level posterior aspect left maxillary sinus suspicious for sinusitis.  No skull fracture.  No intracranial hemorrhage, mass effect or midline shift.  No hydrocephalus.  The gray and white matter differentiation is preserved.  No acute infarction.  No mass lesion is noted on this unenhanced scan.  IMPRESSION: No acute intracranial abnormality.  Bubbly air fluid level posterior aspect of the left maxillary sinus suspicious for sinusitis.   Original Report Authenticated By: Natasha Mead, M.D.   Ct Abdomen Pelvis W Contrast  06/08/2013   *RADIOLOGY REPORT*  Clinical Data: Fatigue and abnormal liver function tests.  History of  pancreatitis.  CT ABDOMEN AND PELVIS WITH CONTRAST  Technique:  Multidetector CT imaging of the abdomen and pelvis was performed following the standard protocol during bolus administration of intravenous contrast.  Contrast: 50mL OMNIPAQUE IOHEXOL 300 MG/ML  SOLN, OMNIPAQUE IOHEXOL 300 MG/ML  SOLN  Comparison: 12/05/2012  Findings: Large calculi are again identified in the proximal aspect of the pancreatic duct at the level of the pancreatic head.  There is some associated progression of associated atrophy of the distal pancreas and persistent dilatation of the pancreatic duct.  No pancreatic mass or evidence of pancreatitis is seen by CT.  The liver shows stable mild steatosis.  No focal mass or evidence of biliary ductal dilatation.  The gallbladder is unremarkable. The spleen, adrenal glands and kidneys are within normal limits.  Bowel loops are normal in caliber and show no evidence of obstruction or inflammation.  No free air or free fluid is visualized.  No focal abscess is identified.  There is no evidence of hernia, mass or enlarged lymph nodes.  Bladder is decompressed. Stable degenerative disc disease is present at L5-S1 with associated vacuum disc and significant disc space narrowing.  IMPRESSION: No acute findings by CT.  Large calculi are again identified within the pancreatic duct.  There is some progression of atrophy of the distal pancreas compared to the prior study.   Original Report Authenticated By: Irish Lack, M.D.     Date: 06/08/2013  Rate: 69  Rhythm: normal sinus rhythm  QRS Axis: normal  Intervals: normal  ST/T Wave abnormalities: normal  Conduction Disutrbances:none  Narrative Interpretation:   Old EKG Reviewed: unchanged    1. Hypothyroid   2. Myxedema       MDM  Discussed with hospitalist. They concurred that patient could be discharged home with close followup. Clinically highly suspicious for hyperthyroidism. Call Dr. Carlena Sax for followup and they have  gotten to thyroid stimulating hormone results back and they were consistent with hypothyroidism. Patient does not have a history of thyroid problems this is a new diagnosis. [facility] offices calling in a prescription for her to pick up to start synthetic thyroid medication. They will follow her up  in the office on Monday or Tuesday. Patient will continue to hold on the Cipro and the lisinopril. She return here for any newer worse symptoms. The TSH  Was the missing piece of the puzzle that is now back and it does confirm the problem.  Patient's labs compared to Wednesday are significantly improved marked improvement in the urinary function. Creatinine is now down to 1.48 from 2.55. CT of head CT of abdomen without significant abnormalities. Patient is still very fatigued and mentally on the slow side but no acute mental status changes. Patient's blood pressure is also on the low side running around 103109 systolic all this is consistent with myxedema.       I personally performed the services described in this documentation, which was scribed in my presence. The recorded information has been reviewed and is accurate.      Elizabeth Jakes, MD 06/08/13 [phone]

## 2013-06-08 NOTE — ED Notes (Addendum)
Pt states she was seen by Dr. Sherwood Gambler on Wednesday. States she received a call due to abnormal "blood count" and thyroid. Pt states she is supposed to have an abdominal CT this afternoon at 5pm. Unsure why she is having it, denies abdominal pain.

## 2013-06-08 NOTE — ED Notes (Signed)
Patient to CT. Patient remains on monitor.

## 2013-06-09 LAB — URINE CULTURE

## 2013-06-11 LAB — PATHOLOGIST SMEAR REVIEW: Path Review: REACTIVE

## 2013-07-06 ENCOUNTER — Ambulatory Visit (INDEPENDENT_AMBULATORY_CARE_PROVIDER_SITE_OTHER): Payer: Managed Care, Other (non HMO) | Admitting: Urology

## 2013-07-06 DIAGNOSIS — R3915 Urgency of urination: Secondary | ICD-10-CM

## 2013-07-06 DIAGNOSIS — N952 Postmenopausal atrophic vaginitis: Secondary | ICD-10-CM

## 2013-07-06 DIAGNOSIS — R350 Frequency of micturition: Secondary | ICD-10-CM

## 2013-07-06 DIAGNOSIS — N3946 Mixed incontinence: Secondary | ICD-10-CM

## 2013-08-31 ENCOUNTER — Ambulatory Visit: Payer: Managed Care, Other (non HMO) | Admitting: Internal Medicine

## 2013-09-07 ENCOUNTER — Ambulatory Visit (INDEPENDENT_AMBULATORY_CARE_PROVIDER_SITE_OTHER): Payer: Managed Care, Other (non HMO) | Admitting: Internal Medicine

## 2013-09-07 ENCOUNTER — Encounter: Payer: Self-pay | Admitting: Internal Medicine

## 2013-09-07 VITALS — BP 128/88 | HR 68 | Ht 60.0 in | Wt 181.7 lb

## 2013-09-07 DIAGNOSIS — I8390 Asymptomatic varicose veins of unspecified lower extremity: Secondary | ICD-10-CM

## 2013-09-07 DIAGNOSIS — I83893 Varicose veins of bilateral lower extremities with other complications: Secondary | ICD-10-CM | POA: Insufficient documentation

## 2013-09-07 NOTE — Patient Instructions (Addendum)
Your physician has requested that you have a lower extremity venous duplex. This test is an ultrasound of the veins in the legs. It looks at venous blood flow that carries blood from the heart to the legs. Allow one hour for a Lower Venous exam.  There are no restrictions or special instructions.  Please wear compression stockings during the day. Remove at night. R ankle 7.5in R calf 16in L ankle 7.5in L calf 16in Size M  Please do not the wear stockings 2 days prior to your procedure.   Your physician recommends that you schedule a follow-up appointment in: 3 months.

## 2013-09-07 NOTE — Progress Notes (Signed)
OFFICE NOTE  Chief Complaint:  Leg pain, lower extremity edema  Primary Care Physician: Elizabeth [name]., MD  HPI:  Elizabeth [name] is a pleasant 57 year old female referred by Dr. Carlena [name] for evaluation of lower extremity pain and swelling. Her past medical history significant for hypertension and mild hypothyroidism, and a long time smoking history with recent smoking cessation. 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 Over time she has noted progressive new development of spider veins and lower extremity swelling of both legs. In addition she has had pain, discomfort, aching, cramps and restlessness in both legs. There is a family history of varicose veins in her father who also had superficial phlebitis. She has never had an evaluation of her legs before. She does report swelling which is worse toward the end of the day and is improved somewhat in the morning by elevating her feet with pillow at night. She has never worn compression stockings. She denies any sores or ulcers on her feet. She is noted to be obese and has a prior history of pregnancy in the past. She also reports twinges in her lower back and has some intermittent pain which has not been evaluated.  PMHx:  Past Medical History  Diagnosis Date  . Hypertension   . Pancreatitis due to common bile duct stone 11/2012  . Hypothyroidism     Past Surgical History  Procedure Laterality Date  . Appendectomy    . Hernia repair    . Abdominal hysterectomy    . Tubal ligation    . Ercp    . Right rotator cuff repair    . Colonoscopy N/A 01/31/2013    Procedure: COLONOSCOPY;  Surgeon: Elizabeth Hippo, MD;  Location: [facility];  Service: Endoscopy;  Laterality: N/A;  100    FAMHx:  Family History  Problem Relation Age of Onset  . Colon cancer Neg Hx   . Ovarian cancer Mother   . COPD Father   . Diabetes Brother   . Liver cancer Brother   . Hypertension Sister      SOCHx:   reports that she quit smoking about 8 months ago. Her smoking use included Cigarettes. She has a 82 pack-year smoking history. She has never used smokeless tobacco. She reports that she does not drink alcohol or use illicit drugs.  ALLERGIES:  Allergies  Allergen Reactions  . Codeine Itching and Other (See Comments)    Restlessness     ROS: A comprehensive review of systems was negative except for: Cardiovascular: positive for lower extremity edema and spider veins  HOME MEDS: Current Outpatient Prescriptions  Medication Sig Dispense Refill  . acetaminophen (TYLENOL) 500 MG tablet Take 500 mg by mouth every 6 (six) hours as needed. Pain.      Marland Kitchen ibuprofen (ADVIL,MOTRIN) 200 MG tablet Take 400 mg by mouth every 6 (six) hours as needed for pain or headache.      . lisinopril (PRINIVIL,ZESTRIL) 10 MG tablet Take 10 mg by mouth daily.      Marland Kitchen SYNTHROID 50 MCG tablet Take 75 mcg by mouth daily before breakfast.        No current facility-administered medications for this visit.    LABS/IMAGING: No results found for this or any previous visit (from the past 48 hour(s)). No results found.  VITALS: BP 128/88  Pulse 68  Ht 5' (1.524 m)  Wt 181 lb 11.2 oz (  82.419 kg)  BMI 35.49 kg/m2  EXAM: Extremities: varicose veins noted, venous stasis dermatitis noted and no hemosiderin deposition noted Pulses: 2+ and symmetric Skin: Skin color, texture, turgor normal. No rashes or lesions CEAP 1,2,3 bilateral disease  EKG: deferred  ASSESSMENT: 1. CEAP 1,2,3 bilateral disease, worse on R>L 2. Bilateral lower extremity edema 3. Low back pain, ?sciatica  PLAN: 1.   Mrs. Elizabeth [name] does have bilateral spider veins and varicose veins along with edema and symptoms consistent with venous reflux disease. She reports that she briefly wore a nonprescription stockings with some benefit. I would like to fit her with knee-high bilateral 20-30 mmHg compression stockings today, which should  help her symptomatically. She is aware that we would need to wait at least 90 days before any attempts at intervention if it is appropriate. In the meantime, would recommend bilateral venous insufficiency study to map her veins and determine if there is any underlying significant insufficiency. It could also be possible that some of her symptoms are related to low back pain or possible sciatica, and I will defer workup for this to your office. Plan is to see her back in 3 months we will notify her the results of her study.  Thank you again for referring her.  Elizabeth Nose, MD, [facility] Attending Cardiologist [facility]  [name],[name] 09/07/2013, 1:07 PM

## 2013-09-14 ENCOUNTER — Ambulatory Visit (HOSPITAL_COMMUNITY)
Admission: RE | Admit: 2013-09-14 | Discharge: 2013-09-14 | Disposition: A | Discharge status: Home / Self Care | Payer: Managed Care, Other (non HMO) | Source: Ambulatory Visit | Attending: Internal Medicine | Admitting: Internal Medicine

## 2013-09-14 DIAGNOSIS — I8390 Asymptomatic varicose veins of unspecified lower extremity: Secondary | ICD-10-CM | POA: Insufficient documentation

## 2013-09-14 NOTE — Progress Notes (Signed)
Lower Extremity Venous Duplex Completed. °Brianna L Mazza,RVT °

## 2013-09-27 ENCOUNTER — Telehealth: Payer: Self-pay | Admitting: Internal Medicine

## 2013-09-27 NOTE — Telephone Encounter (Signed)
Forward to jenna,HILTY

## 2013-09-27 NOTE — Telephone Encounter (Signed)
Test results were called back last week but she has questions about procedures Dr Rennis Golden reommended  Please call

## 2013-09-28 NOTE — Telephone Encounter (Signed)
Informed patient on sclerotherapy or removal or vein as ablation alternative.Marland Kitchen

## 2013-12-07 ENCOUNTER — Ambulatory Visit (INDEPENDENT_AMBULATORY_CARE_PROVIDER_SITE_OTHER): Payer: Managed Care, Other (non HMO) | Admitting: Internal Medicine

## 2013-12-07 ENCOUNTER — Encounter: Payer: Self-pay | Admitting: Internal Medicine

## 2013-12-07 VITALS — BP 145/93 | HR 83 | Ht 60.0 in | Wt 185.6 lb

## 2013-12-07 DIAGNOSIS — I10 Essential (primary) hypertension: Secondary | ICD-10-CM

## 2013-12-07 DIAGNOSIS — I8390 Asymptomatic varicose veins of unspecified lower extremity: Secondary | ICD-10-CM

## 2013-12-07 DIAGNOSIS — I83893 Varicose veins of bilateral lower extremities with other complications: Secondary | ICD-10-CM

## 2013-12-07 NOTE — Patient Instructions (Signed)
Dr. Rennis Golden has referred you to [facility] - Dr. Molli Hazard [address] [place] [phone] FAX [phone]

## 2013-12-07 NOTE — Progress Notes (Signed)
OFFICE NOTE  Chief Complaint:  Leg pain, lower extremity edema  Primary Care Physician: Cassell Smiles., MD  HPI:  Elizabeth [name] is a pleasant 57 year old female referred by Dr. Carlena Sax for evaluation of lower extremity pain and swelling. Her past medical history significant for hypertension and mild hypothyroidism, and a long time smoking history with recent smoking cessation. 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 Over time she has noted progressive new development of spider veins and lower extremity swelling of both legs. In addition she has had pain, discomfort, aching, cramps and restlessness in both legs. There is a family history of varicose veins in her father who also had superficial phlebitis. She has never had an evaluation of her legs before. She does report swelling which is worse toward the end of the day and is improved somewhat in the morning by elevating her feet with pillow at night. She has never worn compression stockings. She denies any sores or ulcers on her feet. She is noted to be obese and has a prior history of pregnancy in the past. She also reports twinges in her lower back and has some intermittent pain which has not been evaluated.  Mrs. Elizabeth [name] returns today to review the results of her lower extremity ultrasound. This was significant for 2 findings. The first finding was that she had a significant Baker's cyst measuring 4.6 x 1.1 x 1.4 cm. This was in the right popliteal fossa an area where she has pain especially when bending down. The second was that she had a very superficial anterior accessory vein in the right leg which measured 7.1 mm. There was continuous reflux and this was noted to be very superficial and quite tortuous. Both of these findings could be contributing to her right leg pain and swelling.  PMHx:  Past Medical History  Diagnosis Date  . Hypertension   . Pancreatitis due to  common bile duct stone 11/2012  . Hypothyroidism     Past Surgical History  Procedure Laterality Date  . Appendectomy    . Hernia repair    . Abdominal hysterectomy    . Tubal ligation    . Ercp    . Right rotator cuff repair    . Colonoscopy N/A 01/31/2013    Procedure: COLONOSCOPY;  Surgeon: Malissa Hippo, MD;  Location: [facility];  Service: Endoscopy;  Laterality: N/A;  100    FAMHx:  Family History  Problem Relation Age of Onset  . Colon cancer Neg Hx   . Ovarian cancer Mother   . COPD Father   . Diabetes Brother   . Liver cancer Brother   . Hypertension Sister     SOCHx:   reports that she quit smoking about a year ago. Her smoking use included Cigarettes. She has a 82 pack-year smoking history. She has never used smokeless tobacco. She reports that she does not drink alcohol or use illicit drugs.  ALLERGIES:  Allergies  Allergen Reactions  . Codeine Itching and Other (See Comments)    Restlessness     ROS: A comprehensive review of systems was negative except for: Cardiovascular: positive for lower extremity edema and spider veins  HOME MEDS: Current Outpatient Prescriptions  Medication Sig Dispense Refill  . acetaminophen (TYLENOL) 500 MG tablet Take 500 mg by mouth every 6 (six) hours as needed. Pain.      Marland Kitchen ibuprofen (ADVIL,MOTRIN) 200  MG tablet Take 400 mg by mouth every 6 (six) hours as needed for pain or headache.      . lisinopril (PRINIVIL,ZESTRIL) 10 MG tablet Take 10 mg by mouth daily.      Marland Kitchen SYNTHROID 50 MCG tablet Take 75 mcg by mouth daily before breakfast.        No current facility-administered medications for this visit.    LABS/IMAGING: No results found for this or any previous visit (from the past 48 hour(s)). No results found.  VITALS: BP 145/93  Pulse 83  Ht 5' (1.524 m)  Wt 185 lb 9.6 oz (84.188 kg)  BMI 36.25 kg/m2  EXAM: Extremities: varicose veins noted, venous stasis dermatitis noted and no hemosiderin deposition  noted Pulses: 2+ and symmetric Skin: Skin color, texture, turgor normal. No rashes or lesions CEAP 1,2,3 bilateral disease  EKG: deferred  ASSESSMENT: 1. CEAP 1,2,3 bilateral disease, worse on R>L 2. Continuous reflux of a right anterior accessory vein 3. Bilateral lower extremity edema 4. Low back pain, ?sciatica 5. Right popliteal fossa Baker's cyst  PLAN: 1.   Mrs. Elizabeth [name] has a large right anterior accessory vein was continuous reflux that is superficial and tortuous. This is not amenable to catheter-based ablation. I do feel that she would benefit from either foam sclerotherapy or stab phlebectomy. We discussed options and felt  That she would like to be referred to [facility]. She may need to see an orthopedist about her right knee Bakers cyst.    No followup with me is necessary.  Chrystie Nose, MD, [facility] Attending Cardiologist [facility]  Kalasia Crafton C 12/07/2013, 9:46 AM

## 2013-12-21 IMAGING — CT CT HEAD W/O CM
1 series · 16 of 30 positions shown, 20 images · non-contrast
Comparison: None

CLINICAL DATA: Fatigue

CT HEAD WITHOUT CONTRAST
TECHNIQUE: Contiguous axial images were obtained from the base of
the skull through the vertex without contrast.

[Series 2: headseq 4.8 h37s · axial · 0.43mm/px · z∈[+1203,+1360]mm · 16 of 36 slices shown, 20 images]
[im 2/36  brain]
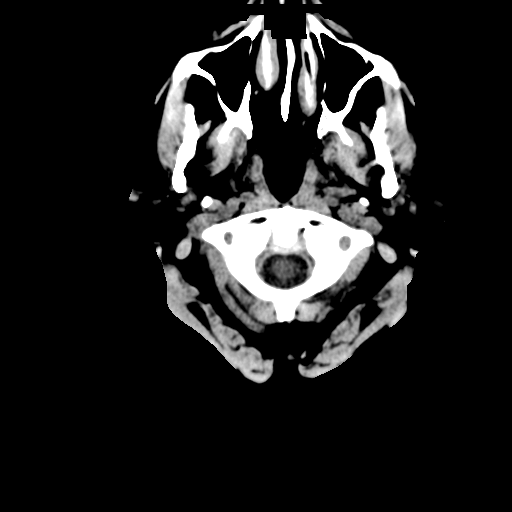
[im 2/36  bone]
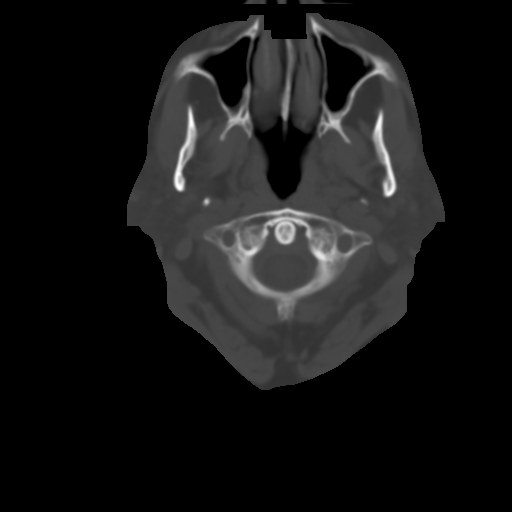
[im 4/36  brain]
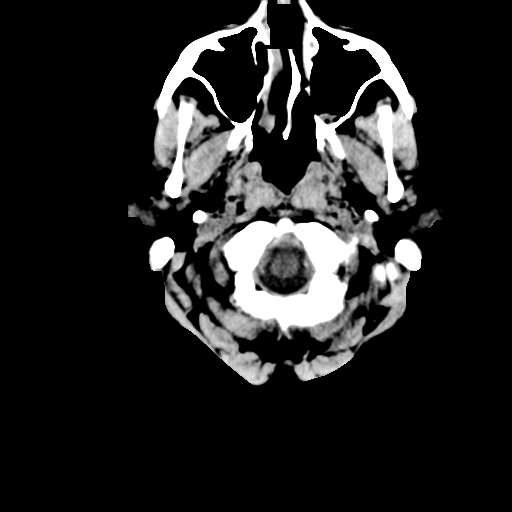
[im 7/36  brain]
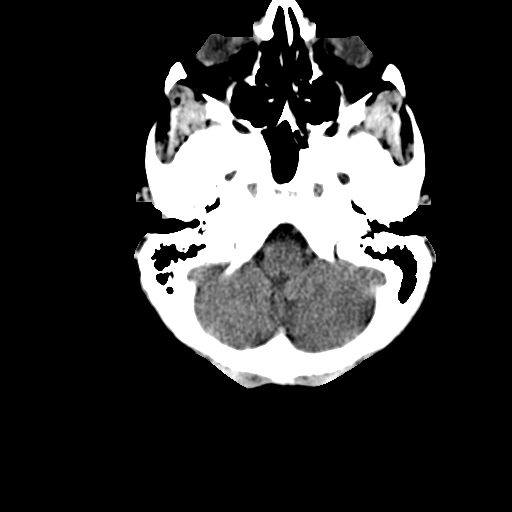
[im 9/36  brain]
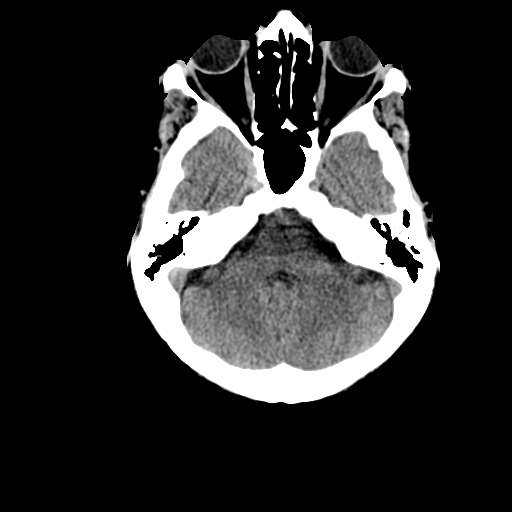
[im 10/36  brain]
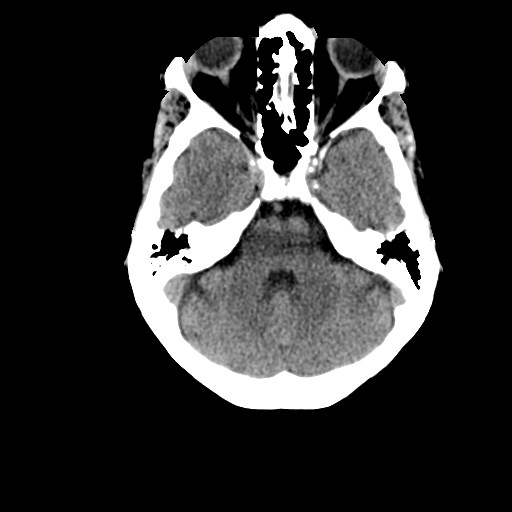
[im 10/36  bone]
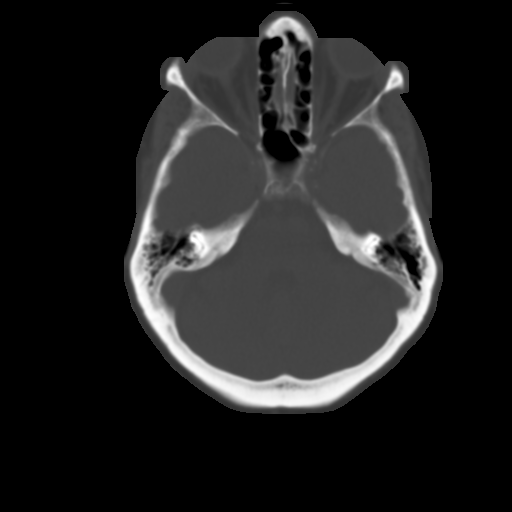
[im 13/36  brain]
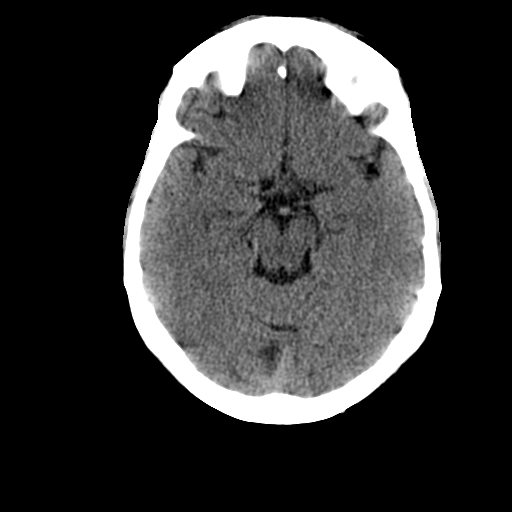
[im 15/36  brain]
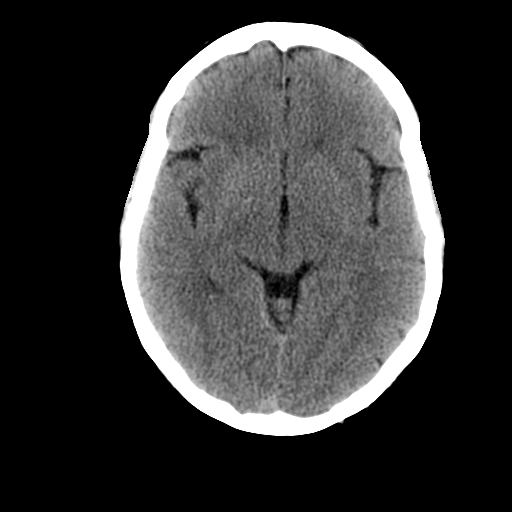
[im 17/36  brain]
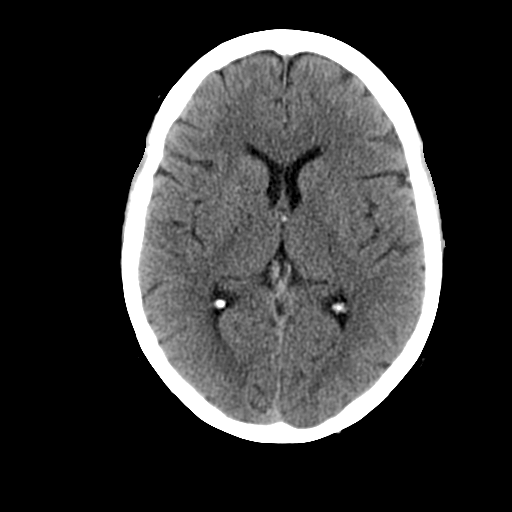
[im 19/36  brain]
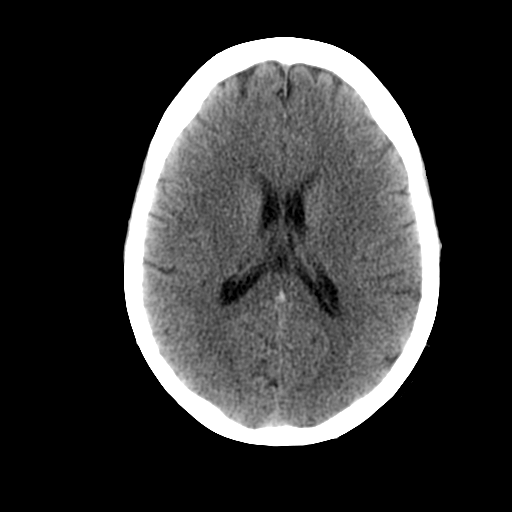
[im 19/36  bone]
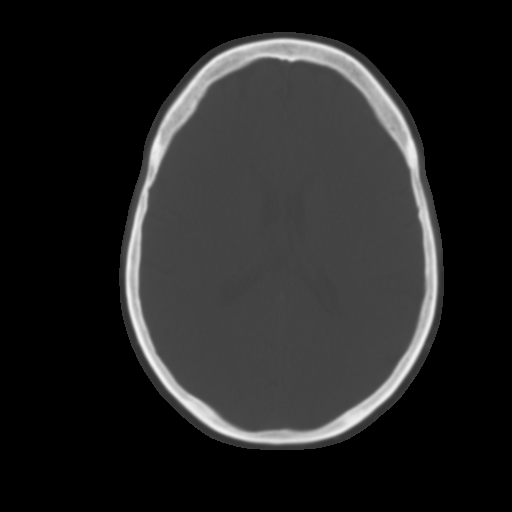
[im 21/36  brain]
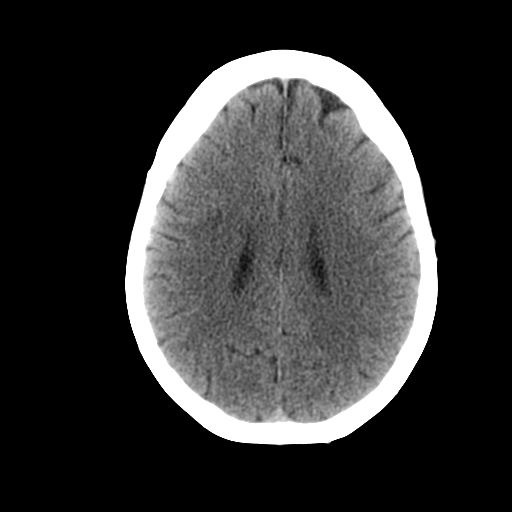
[im 23/36  brain]
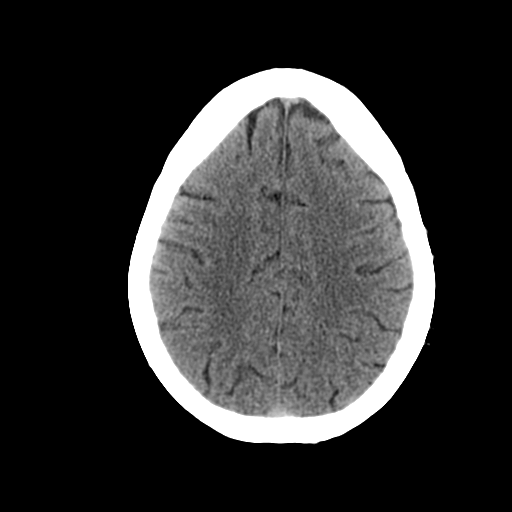
[im 26/36  brain]
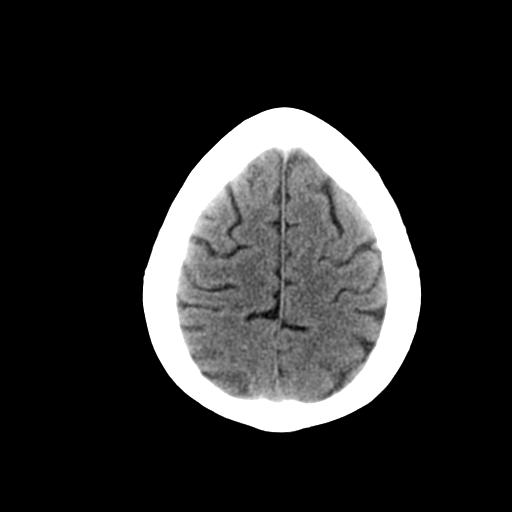
[im 27/36  brain]
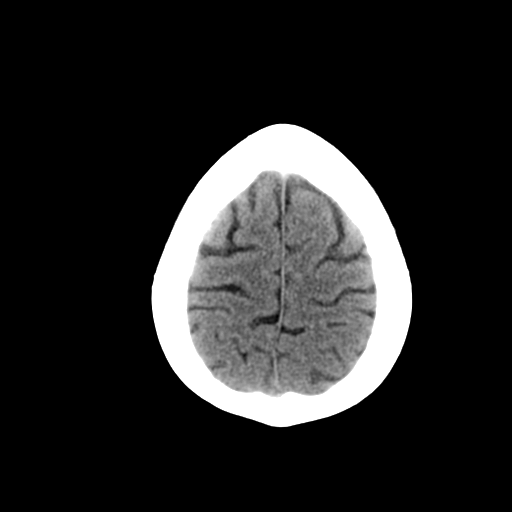
[im 27/36  bone]
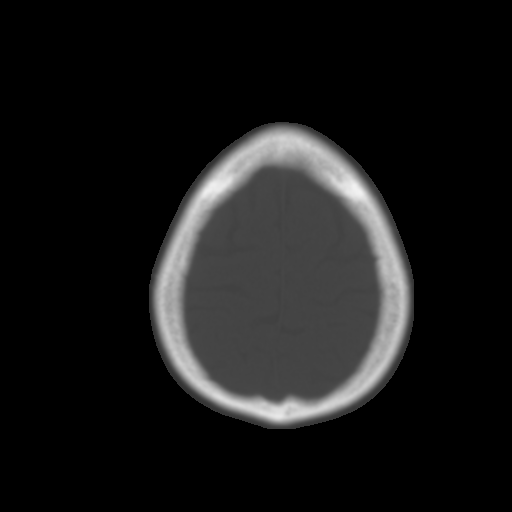
[im 29/36  brain]
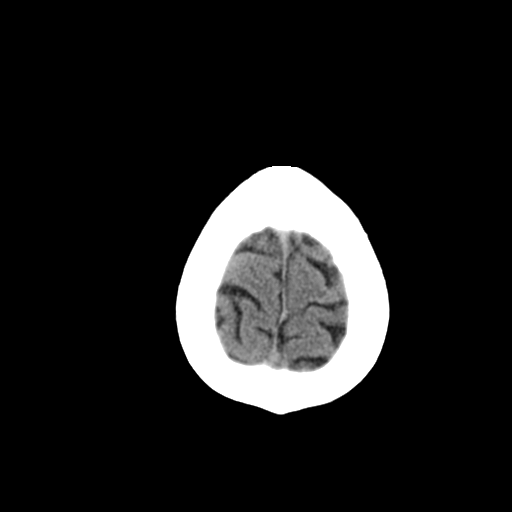
[im 32/36  brain]
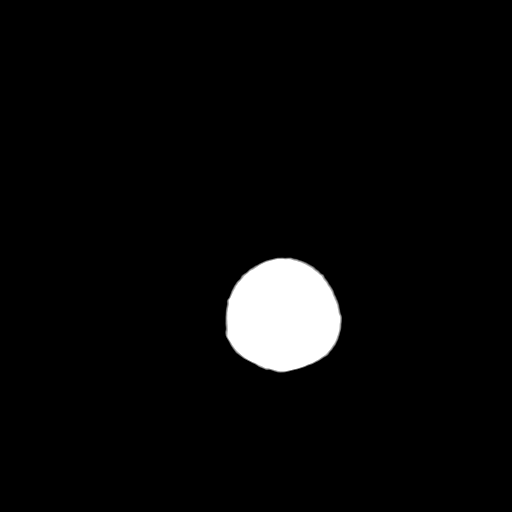
[im 34/36  brain]
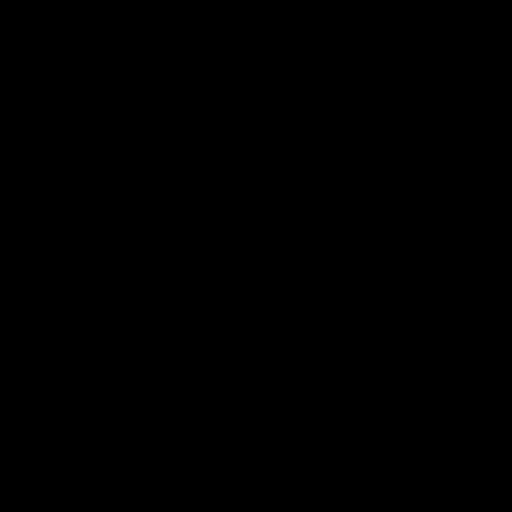

[16 of 30 positions shown; findings below may reference images not displayed]

FINDINGS: There is a bubbly air fluid level posterior aspect left
maxillary sinus suspicious for sinusitis.  No skull fracture.

No intracranial hemorrhage, mass effect or midline shift.  No
hydrocephalus.  The gray and white matter differentiation is
preserved.  No acute infarction.  No mass lesion is noted on this
unenhanced scan.
IMPRESSION: No acute intracranial abnormality.  Bubbly air fluid level
posterior aspect of the left maxillary sinus suspicious for
sinusitis.

## 2014-05-10 ENCOUNTER — Other Ambulatory Visit (HOSPITAL_COMMUNITY): Payer: Self-pay | Admitting: Internal Medicine

## 2014-05-10 DIAGNOSIS — Z1231 Encounter for screening mammogram for malignant neoplasm of breast: Secondary | ICD-10-CM

## 2014-05-17 ENCOUNTER — Telehealth: Payer: Self-pay | Admitting: Obstetrics and Gynecology

## 2014-05-17 ENCOUNTER — Ambulatory Visit (HOSPITAL_COMMUNITY)
Admission: RE | Admit: 2014-05-17 | Discharge: 2014-05-17 | Disposition: A | Discharge status: Home / Self Care | Payer: Managed Care, Other (non HMO) | Source: Ambulatory Visit | Attending: Internal Medicine | Admitting: Internal Medicine

## 2014-05-17 DIAGNOSIS — Z1231 Encounter for screening mammogram for malignant neoplasm of breast: Secondary | ICD-10-CM | POA: Insufficient documentation

## 2014-05-17 NOTE — Telephone Encounter (Addendum)
Pt states Mother died from ovarian cancer, states unsure when she should get CA125 rechecked and repeat ultrasound due to family history. Per Dr. Elonda Husky needs to repeat CA125 and ultrasound every two years, last done 03/06/12. Call transferred to front staff for an ultrasound and appt with Dr. Elonda Husky to be scheduled.

## 2014-05-21 ENCOUNTER — Other Ambulatory Visit: Payer: Self-pay | Admitting: Obstetrics & Gynecology

## 2014-05-21 DIAGNOSIS — Z8041 Family history of malignant neoplasm of ovary: Secondary | ICD-10-CM

## 2014-05-24 ENCOUNTER — Ambulatory Visit (INDEPENDENT_AMBULATORY_CARE_PROVIDER_SITE_OTHER): Payer: Managed Care, Other (non HMO)

## 2014-05-24 ENCOUNTER — Encounter: Payer: Self-pay | Admitting: Obstetrics & Gynecology

## 2014-05-24 ENCOUNTER — Ambulatory Visit (INDEPENDENT_AMBULATORY_CARE_PROVIDER_SITE_OTHER): Payer: Managed Care, Other (non HMO) | Admitting: Obstetrics & Gynecology

## 2014-05-24 VITALS — BP 140/90 | Wt 194.4 lb

## 2014-05-24 DIAGNOSIS — Z8041 Family history of malignant neoplasm of ovary: Secondary | ICD-10-CM

## 2014-05-24 NOTE — Progress Notes (Signed)
Patient ID: Elizabeth [name], female   DOB: 21-Aug-1956, 58 y.o.   MRN: [id number] US Transvaginal Non-ob  05/24/2014   GYNECOLOGIC SONOGRAM   Elizabeth [name] is a 58 y.o. for a pelvic sonogram for family ovarian  cancer. Pt is s/p hysterectomy.  Uterus                      Surgically absent Vaginal Cuff appears WNL  Right ovary             1.5 x 1.2 x 0.9 cm,   Left ovary                1.7 x 0.9 x 0.9 cm,   No free fluid or adnexal masses noted within the pelvis  *(Note post-void residual bladder measurement-89cc)  Technician Comments:  Vaginal Cuff appears WNL, bilateral adnexa/ovaries appear WNL no free  fluid or adnexal masses noted within the pelvis (Note post-void residual bladder measurement-89cc)  Alicia Amel 05/24/2014 9:11 AM  Normal pelvic sonogram, specifically normal post menopausal ovaries  Florian Buff 05/24/2014 10:02 AM    sonogram as above read and reviewed Blood pressure 140/90, weight 194 lb 6.4 oz (88.179 kg).  No specific complaints. Mom had ovarian cancer.  Ca 125 to be done today  No burning with urination, frequency or urgency No nausea, vomiting or diarrhea Nor fever chills or other constitutional symptoms  No other specific finidings  Yearly follow up when wanted  Past Medical History  Diagnosis Date  . Hypertension   . Pancreatitis due to common bile duct stone 11/2012  . Hypothyroidism     Past Surgical History  Procedure Laterality Date  . Appendectomy    . Hernia repair    . Abdominal hysterectomy    . Tubal ligation    . Ercp    . Right rotator cuff repair    . Colonoscopy N/A 01/31/2013    Procedure: COLONOSCOPY;  Surgeon: Rogene Houston, MD;  Location: [facility];  Service: Endoscopy;  Laterality: N/A;  100    OB History   Grav Para Term Preterm Abortions TAB SAB Ect Mult Living                  Allergies  Allergen Reactions  . Codeine Itching and Other (See Comments)    Restlessness     History   Social History  . Marital Status:  Divorced    Spouse Name: N/A    Number of Children: 2  . Years of Education: N/A   Occupational History  .  [employer]   Social History Main Topics  . Smoking status: Former Smoker -- 2.00 packs/day for 41 years    Types: Cigarettes    Quit date: 12/11/2012  . Smokeless tobacco: Never Used  . Alcohol Use: No  . Drug Use: No  . Sexual Activity: None   Other Topics Concern  . None   Social History Narrative  . None    Family History  Problem Relation Age of Onset  . Colon cancer Neg Hx   . Ovarian cancer Mother   . COPD Father   . Diabetes Brother   . Liver cancer Brother   . Hypertension Sister

## 2014-05-25 LAB — CA 125: CA 125: 9 U/mL (ref 0.0–30.2)

## 2014-06-14 ENCOUNTER — Encounter: Payer: Self-pay | Admitting: Obstetrics & Gynecology

## 2014-06-14 ENCOUNTER — Ambulatory Visit (INDEPENDENT_AMBULATORY_CARE_PROVIDER_SITE_OTHER): Payer: Managed Care, Other (non HMO) | Admitting: Obstetrics & Gynecology

## 2014-06-14 VITALS — BP 120/90 | Ht 60.0 in | Wt 193.0 lb

## 2014-06-14 DIAGNOSIS — Z1212 Encounter for screening for malignant neoplasm of rectum: Secondary | ICD-10-CM

## 2014-06-14 DIAGNOSIS — Z01419 Encounter for gynecological examination (general) (routine) without abnormal findings: Secondary | ICD-10-CM

## 2014-06-14 NOTE — Progress Notes (Signed)
Patient ID: Elizabeth [name], female   DOB: 05/28/1956, 58 y.o.   MRN: [id number] Blood pressure 120/90, height 5' (1.524 m), weight 193 lb (87.544 kg).  Subjective:     Elizabeth [name] is a 58 y.o. female here for a routine exam.  No LMP recorded. Patient has had a hysterectomy. No obstetric history on file. Birth Control Method:  hyst Menstrual Calendar(currently): hyst  Current complaints: none.   Current acute medical issues:  none   Recent Gynecologic History No LMP recorded. Patient has had a hysterectomy. Last Pap: years ago,   Last mammogram: 2015,  normal  Past Medical History  Diagnosis Date  . Hypertension   . Pancreatitis due to common bile duct stone 11/2012  . Hypothyroidism     Past Surgical History  Procedure Laterality Date  . Appendectomy    . Hernia repair    . Abdominal hysterectomy    . Tubal ligation    . Ercp    . Right rotator cuff repair    . Colonoscopy N/A 01/31/2013    Procedure: COLONOSCOPY;  Surgeon: Rogene Houston, MD;  Location: [facility];  Service: Endoscopy;  Laterality: N/A;  100    OB History   Grav Para Term Preterm Abortions TAB SAB Ect Mult Living                  History   Social History  . Marital Status: Divorced    Spouse Name: N/A    Number of Children: 2  . Years of Education: N/A   Occupational History  .  [employer]   Social History Main Topics  . Smoking status: Former Smoker -- 2.00 packs/day for 41 years    Types: Cigarettes    Quit date: 12/11/2012  . Smokeless tobacco: Never Used  . Alcohol Use: No  . Drug Use: No  . Sexual Activity: None   Other Topics Concern  . None   Social History Narrative  . None    Family History  Problem Relation Age of Onset  . Colon cancer Neg Hx   . Ovarian cancer Mother   . COPD Father   . Diabetes Brother   . Liver cancer Brother   . Hypertension Sister      Review of Systems  Review of Systems  Constitutional: Negative for fever, chills, weight  loss, malaise/fatigue and diaphoresis.  HENT: Negative for hearing loss, ear pain, nosebleeds, congestion, sore throat, neck pain, tinnitus and ear discharge.   Eyes: Negative for blurred vision, double vision, photophobia, pain, discharge and redness.  Respiratory: Negative for cough, hemoptysis, sputum production, shortness of breath, wheezing and stridor.   Cardiovascular: Negative for chest pain, palpitations, orthopnea, claudication, leg swelling and PND.  Gastrointestinal: negative for abdominal pain. Negative for heartburn, nausea, vomiting, diarrhea, constipation, blood in stool and melena.  Genitourinary: Negative for dysuria, urgency, frequency, hematuria and flank pain.  Musculoskeletal: Negative for myalgias, back pain, joint pain and falls.  Skin: Negative for itching and rash.  Neurological: Negative for dizziness, tingling, tremors, sensory change, speech change, focal weakness, seizures, loss of consciousness, weakness and headaches.  Endo/Heme/Allergies: Negative for environmental allergies and polydipsia. Does not bruise/bleed easily.  Psychiatric/Behavioral: Negative for depression, suicidal ideas, hallucinations, memory loss and substance abuse. The patient is not nervous/anxious and does not have insomnia.        Objective:    Physical Exam  Vitals reviewed. Constitutional: She is oriented to person, place, and time. She appears  well-developed and well-nourished.  HENT:  Head: Normocephalic and atraumatic.        Right Ear: External ear normal.  Left Ear: External ear normal.  Nose: Nose normal.  Mouth/Throat: Oropharynx is clear and moist.  Eyes: Conjunctivae and EOM are normal. Pupils are equal, round, and reactive to light. Right eye exhibits no discharge. Left eye exhibits no discharge. No scleral icterus.  Neck: Normal range of motion. Neck supple. No tracheal deviation present. No thyromegaly present.  Cardiovascular: Normal rate, regular rhythm, normal heart  sounds and intact distal pulses.  Exam reveals no gallop and no friction rub.   No murmur heard. Respiratory: Effort normal and breath sounds normal. No respiratory distress. She has no wheezes. She has no rales. She exhibits no tenderness.  GI: Soft. Bowel sounds are normal. She exhibits no distension and no mass. There is no tenderness. There is no rebound and no guarding.  Genitourinary:  Breasts no masses skin changes or nipple changes bilaterally      Vulva is normal without lesions Vagina is pink moist without discharge Cervix surgically absent Uterus is surgically absent Adnexa is absent and negative Rectal    hemoccult negative, normal tone, no masses  Musculoskeletal: Normal range of motion. She exhibits no edema and no tenderness.  Neurological: She is alert and oriented to person, place, and time. She has normal reflexes. She displays normal reflexes. No cranial nerve deficit. She exhibits normal muscle tone. Coordination normal.  Skin: Skin is warm and dry. No rash noted. No erythema. No pallor.  Psychiatric: She has a normal mood and affect. Her behavior is normal. Judgment and thought content normal.       Assessment:    Healthy female exam.    Plan:    Follow up in: 2 years.

## 2015-04-18 ENCOUNTER — Ambulatory Visit (HOSPITAL_COMMUNITY)
Admission: RE | Admit: 2015-04-18 | Discharge: 2015-04-18 | Disposition: A | Discharge status: Home / Self Care | Payer: Managed Care, Other (non HMO) | Source: Ambulatory Visit | Attending: Family Medicine | Admitting: Family Medicine

## 2015-04-18 ENCOUNTER — Other Ambulatory Visit (HOSPITAL_COMMUNITY): Payer: Self-pay | Admitting: Family Medicine

## 2015-04-18 DIAGNOSIS — M5136 Other intervertebral disc degeneration, lumbar region: Secondary | ICD-10-CM

## 2015-04-18 DIAGNOSIS — M545 Low back pain: Secondary | ICD-10-CM | POA: Insufficient documentation

## 2015-04-21 ENCOUNTER — Other Ambulatory Visit (HOSPITAL_COMMUNITY): Payer: Self-pay | Admitting: Family Medicine

## 2015-04-21 DIAGNOSIS — M5136 Other intervertebral disc degeneration, lumbar region: Secondary | ICD-10-CM

## 2015-04-21 DIAGNOSIS — M545 Low back pain: Secondary | ICD-10-CM

## 2015-04-28 ENCOUNTER — Ambulatory Visit (HOSPITAL_COMMUNITY)
Admission: RE | Admit: 2015-04-28 | Discharge: 2015-04-28 | Disposition: A | Discharge status: Home / Self Care | Payer: Managed Care, Other (non HMO) | Source: Ambulatory Visit | Attending: Family Medicine | Admitting: Family Medicine

## 2015-04-28 DIAGNOSIS — M5136 Other intervertebral disc degeneration, lumbar region: Secondary | ICD-10-CM | POA: Insufficient documentation

## 2015-04-28 DIAGNOSIS — M545 Low back pain: Secondary | ICD-10-CM | POA: Diagnosis not present

## 2015-06-20 ENCOUNTER — Encounter: Payer: Self-pay | Admitting: Obstetrics & Gynecology

## 2015-06-20 ENCOUNTER — Ambulatory Visit (INDEPENDENT_AMBULATORY_CARE_PROVIDER_SITE_OTHER): Payer: Managed Care, Other (non HMO) | Admitting: Obstetrics & Gynecology

## 2015-06-20 VITALS — BP 132/100 | HR 84 | Ht 59.0 in | Wt 184.0 lb

## 2015-06-20 DIAGNOSIS — Z139 Encounter for screening, unspecified: Secondary | ICD-10-CM

## 2015-06-20 DIAGNOSIS — Z01419 Encounter for gynecological examination (general) (routine) without abnormal findings: Secondary | ICD-10-CM

## 2015-06-20 DIAGNOSIS — Z1211 Encounter for screening for malignant neoplasm of colon: Secondary | ICD-10-CM

## 2015-06-20 DIAGNOSIS — Z1212 Encounter for screening for malignant neoplasm of rectum: Secondary | ICD-10-CM

## 2015-06-20 NOTE — Progress Notes (Signed)
Patient ID: Elizabeth [name], female   DOB: Sep 16, 1956, 59 y.o.   MRN: [id number] Subjective:     Elizabeth [name] is a 59 y.o. female here for a routine exam.  No LMP recorded. Patient has had a hysterectomy. No obstetric history on file. Birth Control Method:  hyst Menstrual Calendar(currently): hyst  Current complaints: none.   Current acute medical issues:  none   Recent Gynecologic History No LMP recorded. Patient has had a hysterectomy. Last Pap: na,   Last mammogram: 05/2014,  normal  Past Medical History  Diagnosis Date  . Hypertension   . Pancreatitis due to common bile duct stone 11/2012  . Hypothyroidism   . Arthritis   . Back pain     Past Surgical History  Procedure Laterality Date  . Appendectomy    . Hernia repair    . Abdominal hysterectomy    . Tubal ligation    . Ercp    . Right rotator cuff repair    . Colonoscopy N/A 01/31/2013    Procedure: COLONOSCOPY;  Surgeon: Rogene Houston, MD;  Location: [facility];  Service: Endoscopy;  Laterality: N/A;  100    OB History    No data available      History   Social History  . Marital Status: Divorced    Spouse Name: N/A  . Number of Children: 2  . Years of Education: N/A   Occupational History  .  [employer]   Social History Main Topics  . Smoking status: Former Smoker -- 2.00 packs/day for 41 years    Types: Cigarettes    Quit date: 12/11/2012  . Smokeless tobacco: Never Used  . Alcohol Use: Yes     Comment: rarely  . Drug Use: No  . Sexual Activity: Not Currently    Birth Control/ Protection: Surgical     Comment: hyst   Other Topics Concern  . None   Social History Narrative    Family History  Problem Relation Age of Onset  . Colon cancer Neg Hx   . Ovarian cancer Mother   . COPD Father   . Diabetes Brother   . Liver cancer Brother   . Hypertension Sister   . Mental illness Sister   . Hypertension Son   . Heart attack Maternal Grandfather   . Other Maternal Grandfather      had a trach     Current outpatient prescriptions:  .  acetaminophen (TYLENOL) 500 MG tablet, Take 500 mg by mouth every 6 (six) hours as needed. Pain., Disp: , Rfl:  .  cyclobenzaprine (FLEXERIL) 10 MG tablet, 10 mg as needed. , Disp: , Rfl:  .  HYDROcodone-acetaminophen (NORCO/VICODIN) 5-325 MG per tablet, Takes half a tab as needed, Disp: , Rfl:  .  ibuprofen (ADVIL,MOTRIN) 200 MG tablet, Take 400 mg by mouth every 6 (six) hours as needed for pain or headache., Disp: , Rfl:  .  lisinopril (PRINIVIL,ZESTRIL) 10 MG tablet, Take 10 mg by mouth daily., Disp: , Rfl:  .  meloxicam (MOBIC) 7.5 MG tablet, Take 7.5 mg by mouth as needed. , Disp: , Rfl:  .  SYNTHROID 50 MCG tablet, Take 75 mcg by mouth daily before breakfast. , Disp: , Rfl:   Review of Systems  Review of Systems  Constitutional: Negative for fever, chills, weight loss, malaise/fatigue and diaphoresis.  HENT: Negative for hearing loss, ear pain, nosebleeds, congestion, sore throat, neck pain, tinnitus and ear discharge.   Eyes: Negative for  blurred vision, double vision, photophobia, pain, discharge and redness.  Respiratory: Negative for cough, hemoptysis, sputum production, shortness of breath, wheezing and stridor.   Cardiovascular: Negative for chest pain, palpitations, orthopnea, claudication, leg swelling and PND.  Gastrointestinal: negative for abdominal pain. Negative for heartburn, nausea, vomiting, diarrhea, constipation, blood in stool and melena.  Genitourinary: Negative for dysuria, urgency, frequency, hematuria and flank pain.  Musculoskeletal: Negative for myalgias, back pain, joint pain and falls.  Skin: Negative for itching and rash.  Neurological: Negative for dizziness, tingling, tremors, sensory change, speech change, focal weakness, seizures, loss of consciousness, weakness and headaches.  Endo/Heme/Allergies: Negative for environmental allergies and polydipsia. Does not bruise/bleed easily.   Psychiatric/Behavioral: Negative for depression, suicidal ideas, hallucinations, memory loss and substance abuse. The patient is not nervous/anxious and does not have insomnia.        Objective:  Blood pressure 132/100, pulse 84, height 4\' 11"  (1.499 m), weight 184 lb (83.462 kg).   Physical Exam  Vitals reviewed. Constitutional: She is oriented to person, place, and time. She appears well-developed and well-nourished.  HENT:  Head: Normocephalic and atraumatic.        Right Ear: External ear normal.  Left Ear: External ear normal.  Nose: Nose normal.  Mouth/Throat: Oropharynx is clear and moist.  Eyes: Conjunctivae and EOM are normal. Pupils are equal, round, and reactive to light. Right eye exhibits no discharge. Left eye exhibits no discharge. No scleral icterus.  Neck: Normal range of motion. Neck supple. No tracheal deviation present. No thyromegaly present.  Cardiovascular: Normal rate, regular rhythm, normal heart sounds and intact distal pulses.  Exam reveals no gallop and no friction rub.   No murmur heard. Respiratory: Effort normal and breath sounds normal. No respiratory distress. She has  Wheezes consistent with early COPD. She has no rales. She exhibits no tenderness.  GI: Soft. Bowel sounds are normal. She exhibits no distension and no mass. There is no tenderness. There is no rebound and no guarding.  Genitourinary:  Breasts no masses skin changes or nipple changes bilaterally      Vulva is normal without lesions Vagina is pink moist without discharge Cervix absent Uterus is absent Adnexa is negative  {Rectal    hemoccult negative, normal tone, no masses  Musculoskeletal: Normal range of motion. She exhibits no edema and no tenderness.  Neurological: She is alert and oriented to person, place, and time. She has normal reflexes. She displays normal reflexes. No cranial nerve deficit. She exhibits normal muscle tone. Coordination normal.  Skin: Skin is warm and dry. No  rash noted. No erythema. No pallor.  Psychiatric: She has a normal mood and affect. Her behavior is normal. Judgment and thought content normal.       Assessment:    Healthy female exam.   Early COPD Plan:    Mammogram ordered. Follow up in: 1 year.    Contact dr fusco regarding early COPD

## 2015-10-03 ENCOUNTER — Other Ambulatory Visit (HOSPITAL_COMMUNITY): Payer: Self-pay | Admitting: Internal Medicine

## 2015-10-03 DIAGNOSIS — Z1231 Encounter for screening mammogram for malignant neoplasm of breast: Secondary | ICD-10-CM

## 2015-10-09 ENCOUNTER — Ambulatory Visit (HOSPITAL_COMMUNITY)
Admission: RE | Admit: 2015-10-09 | Discharge: 2015-10-09 | Disposition: A | Discharge status: Home / Self Care | Payer: Managed Care, Other (non HMO) | Source: Ambulatory Visit | Attending: Internal Medicine | Admitting: Internal Medicine

## 2015-10-09 DIAGNOSIS — Z1231 Encounter for screening mammogram for malignant neoplasm of breast: Secondary | ICD-10-CM | POA: Insufficient documentation

## 2016-04-16 ENCOUNTER — Ambulatory Visit: Payer: Managed Care, Other (non HMO) | Admitting: Neurology

## 2016-06-08 ENCOUNTER — Encounter (HOSPITAL_COMMUNITY): Payer: Self-pay | Admitting: Emergency Medicine

## 2016-06-08 ENCOUNTER — Emergency Department (HOSPITAL_COMMUNITY)
Admission: EM | Admit: 2016-06-08 | Discharge: 2016-06-08 | Disposition: A | Discharge status: Home / Self Care | Payer: 59 | Attending: Emergency Medicine | Admitting: Emergency Medicine

## 2016-06-08 DIAGNOSIS — M79605 Pain in left leg: Secondary | ICD-10-CM | POA: Insufficient documentation

## 2016-06-08 DIAGNOSIS — I10 Essential (primary) hypertension: Secondary | ICD-10-CM | POA: Diagnosis not present

## 2016-06-08 DIAGNOSIS — M25562 Pain in left knee: Secondary | ICD-10-CM | POA: Insufficient documentation

## 2016-06-08 DIAGNOSIS — E039 Hypothyroidism, unspecified: Secondary | ICD-10-CM | POA: Diagnosis not present

## 2016-06-08 DIAGNOSIS — M1990 Unspecified osteoarthritis, unspecified site: Secondary | ICD-10-CM | POA: Diagnosis not present

## 2016-06-08 DIAGNOSIS — Z87891 Personal history of nicotine dependence: Secondary | ICD-10-CM | POA: Insufficient documentation

## 2016-06-08 LAB — CBC WITH DIFFERENTIAL/PLATELET
BASOS ABS: 0 10*3/uL (ref 0.0–0.1)
Basophils Relative: 0 %
EOS ABS: 0.6 10*3/uL (ref 0.0–0.7)
EOS PCT: 6 %
HCT: 41.8 % (ref 36.0–46.0)
Hemoglobin: 13.8 g/dL (ref 12.0–15.0)
LYMPHS ABS: 4.1 10*3/uL — AB (ref 0.7–4.0)
LYMPHS PCT: 38 %
MCH: 30.8 pg (ref 26.0–34.0)
MCHC: 33 g/dL (ref 30.0–36.0)
MCV: 93.3 fL (ref 78.0–100.0)
MONOS PCT: 7 %
Monocytes Absolute: 0.7 10*3/uL (ref 0.1–1.0)
NEUTROS PCT: 49 %
Neutro Abs: 5.5 10*3/uL (ref 1.7–7.7)
PLATELETS: 320 10*3/uL (ref 150–400)
RBC: 4.48 MIL/uL (ref 3.87–5.11)
RDW: 14.1 % (ref 11.5–15.5)
WBC: 11 10*3/uL — ABNORMAL HIGH (ref 4.0–10.5)

## 2016-06-08 LAB — BASIC METABOLIC PANEL
ANION GAP: 6 (ref 5–15)
BUN: 21 mg/dL — ABNORMAL HIGH (ref 6–20)
CALCIUM: 8.9 mg/dL (ref 8.9–10.3)
CO2: 25 mmol/L (ref 22–32)
Chloride: 108 mmol/L (ref 101–111)
Creatinine, Ser: 0.82 mg/dL (ref 0.44–1.00)
Glucose, Bld: 92 mg/dL (ref 65–99)
POTASSIUM: 3.4 mmol/L — AB (ref 3.5–5.1)
Sodium: 139 mmol/L (ref 135–145)

## 2016-06-08 LAB — D-DIMER, QUANTITATIVE (NOT AT ARMC): D DIMER QUANT: 0.52 ug{FEU}/mL — AB (ref 0.00–0.50)

## 2016-06-08 MED ORDER — ENOXAPARIN SODIUM 80 MG/0.8ML ~~LOC~~ SOLN
1.0000 mg/kg | Freq: Once | SUBCUTANEOUS | Status: AC
  Administered 2016-06-08: 75 mg via SUBCUTANEOUS
  Filled 2016-06-08: qty 0.8

## 2016-06-08 MED ORDER — KETOROLAC TROMETHAMINE 60 MG/2ML IM SOLN
60.0000 mg | Freq: Once | INTRAMUSCULAR | Status: AC
  Administered 2016-06-08: 60 mg via INTRAMUSCULAR
  Filled 2016-06-08: qty 2

## 2016-06-08 NOTE — ED Notes (Signed)
PT stated she fell getting of bed this morning and c/o back of left leg and left knee pain today. PT states has had knee injections in that knee previously.

## 2016-06-08 NOTE — Discharge Instructions (Signed)

## 2016-06-08 NOTE — ED Provider Notes (Signed)
CSN: [id number]     Arrival date & time 06/08/16  1841 History   First MD Initiated Contact with Patient 06/08/16 1924     Chief Complaint  Patient presents with  . Leg Pain     (Consider location/radiation/quality/duration/timing/severity/associated sxs/prior Treatment) Patient is a 60 y.o. female presenting with leg pain. The history is provided by the patient. No language interpreter was used.  Leg Pain Location:  Knee Injury: no   Knee location:  L knee Pain details:    Quality:  Aching   Radiates to:  Does not radiate   Severity:  Moderate   Onset quality:  Gradual   Progression:  Worsening Chronicity:  New Dislocation: no   Foreign body present:  No foreign bodies Prior injury to area:  Yes Relieved by:  Nothing Worsened by:  Nothing tried Ineffective treatments:  None tried Pt reports she has arthritis in her knee.  Pt sees Dr. Percell Miller.  Pt reports this pain is in her calf and up the back of her leg.  Pt reports feels swollen.  Increased pain with walking  Past Medical History  Diagnosis Date  . Hypertension   . Pancreatitis due to common bile duct stone 11/2012  . Hypothyroidism   . Arthritis   . Back pain    Past Surgical History  Procedure Laterality Date  . Appendectomy    . Hernia repair    . Abdominal hysterectomy    . Tubal ligation    . Ercp    . Right rotator cuff repair    . Colonoscopy N/A 01/31/2013    Procedure: COLONOSCOPY;  Surgeon: Rogene Houston, MD;  Location: [facility];  Service: Endoscopy;  Laterality: N/A;  100   Family History  Problem Relation Age of Onset  . Colon cancer Neg Hx   . Ovarian cancer Mother   . COPD Father   . Diabetes Brother   . Liver cancer Brother   . Hypertension Sister   . Mental illness Sister   . Hypertension Son   . Heart attack Maternal Grandfather   . Other Maternal Grandfather     had a trach   Social History  Substance Use Topics  . Smoking status: Former Smoker -- 1.00 packs/day for 41 years     Types: Cigarettes  . Smokeless tobacco: Never Used  . Alcohol Use: Yes     Comment: rarely   OB History    No data available     Review of Systems  All other systems reviewed and are negative.     Allergies  Codeine  Home Medications   Prior to Admission medications   Medication Sig Start Date End Date Taking? Authorizing Provider  acetaminophen (TYLENOL) 500 MG tablet Take 500 mg by mouth every 6 (six) hours as needed. Pain.    Historical Provider, MD  cyclobenzaprine (FLEXERIL) 10 MG tablet 10 mg as needed.  03/28/15   Historical Provider, MD  HYDROcodone-acetaminophen (NORCO/VICODIN) 5-325 MG per tablet Takes half a tab as needed 05/07/15   Historical Provider, MD  ibuprofen (ADVIL,MOTRIN) 200 MG tablet Take 400 mg by mouth every 6 (six) hours as needed for pain or headache.    Historical Provider, MD  lisinopril (PRINIVIL,ZESTRIL) 10 MG tablet Take 10 mg by mouth daily. 08/31/13   Historical Provider, MD  meloxicam (MOBIC) 7.5 MG tablet Take 7.5 mg by mouth as needed.  03/18/15   Historical Provider, MD  SYNTHROID 50 MCG tablet Take 75 mcg by mouth  daily before breakfast.  08/31/13   Historical Provider, MD   BP 125/64 mmHg  Pulse 78  Temp(Src) 98.1 F (36.7 C) (Oral)  Resp 18  Ht 5' (1.524 m)  Wt 73.936 kg  BMI 31.83 kg/m2  SpO2 96% Physical Exam  Constitutional: She is oriented to person, place, and time. She appears well-developed and well-nourished.  HENT:  Head: Normocephalic.  Eyes: EOM are normal.  Neck: Normal range of motion.  Pulmonary/Chest: Effort normal.  Abdominal: She exhibits no distension.  Musculoskeletal: Normal range of motion. She exhibits tenderness.  cald tender,  homan's causes slight pain, no palpable cord. nv and ns intact  Neurological: She is alert and oriented to person, place, and time.  Psychiatric: She has a normal mood and affect.  Nursing note and vitals reviewed.   ED Course  Procedures (including critical care time) Labs  Review Labs Reviewed  CBC WITH DIFFERENTIAL/PLATELET - Abnormal; Notable for the following:    WBC 11.0 (*)    Lymphs Abs 4.1 (*)    All other components within normal limits  D-DIMER, QUANTITATIVE (NOT AT [facility]) - Abnormal; Notable for the following:    D-Dimer, Quant 0.52 (*)    All other components within normal limits  BASIC METABOLIC PANEL    Imaging Review No results found. I have personally reviewed and evaluated these images and lab results as part of my medical decision-making.   EKG Interpretation None      MDM ultrasound not available.  ddimer slightly elevated.  Pt given lovenox.  Pt to return here tomorrow for ultrasound.  Pt has hydrocodone at home for pain   Final diagnoses:  Leg pain, lateral, left     An After Visit Summary was printed and given to the patient.   Fransico Meadow, PA-C 06/08/16 [place], MD 06/09/16 [phone]

## 2016-06-09 ENCOUNTER — Other Ambulatory Visit (HOSPITAL_COMMUNITY): Payer: Self-pay | Admitting: Physician Assistant

## 2016-06-09 ENCOUNTER — Ambulatory Visit (HOSPITAL_COMMUNITY)
Admit: 2016-06-09 | Discharge: 2016-06-09 | Disposition: A | Discharge status: Home / Self Care | Payer: 59 | Attending: Emergency Medicine | Admitting: Emergency Medicine

## 2016-06-09 DIAGNOSIS — M79605 Pain in left leg: Secondary | ICD-10-CM

## 2016-06-21 ENCOUNTER — Ambulatory Visit: Payer: 59 | Admitting: Neurology

## 2016-07-13 ENCOUNTER — Ambulatory Visit: Payer: 59 | Admitting: Neurology

## 2016-07-28 ENCOUNTER — Ambulatory Visit: Payer: 59 | Admitting: Neurology

## 2016-09-08 ENCOUNTER — Ambulatory Visit: Payer: 59 | Admitting: Neurology

## 2017-10-17 ENCOUNTER — Emergency Department (HOSPITAL_COMMUNITY): Payer: Self-pay

## 2017-10-17 ENCOUNTER — Observation Stay (HOSPITAL_COMMUNITY)
Admission: EM | Admit: 2017-10-17 | Discharge: 2017-10-19 | Disposition: A | Discharge status: Home / Self Care | Payer: Self-pay | Attending: Internal Medicine | Admitting: Internal Medicine

## 2017-10-17 ENCOUNTER — Encounter (HOSPITAL_COMMUNITY): Payer: Self-pay | Admitting: Emergency Medicine

## 2017-10-17 DIAGNOSIS — D72829 Elevated white blood cell count, unspecified: Secondary | ICD-10-CM | POA: Diagnosis present

## 2017-10-17 DIAGNOSIS — Y929 Unspecified place or not applicable: Secondary | ICD-10-CM | POA: Insufficient documentation

## 2017-10-17 DIAGNOSIS — D582 Other hemoglobinopathies: Secondary | ICD-10-CM | POA: Insufficient documentation

## 2017-10-17 DIAGNOSIS — Y9389 Activity, other specified: Secondary | ICD-10-CM | POA: Insufficient documentation

## 2017-10-17 DIAGNOSIS — J40 Bronchitis, not specified as acute or chronic: Secondary | ICD-10-CM

## 2017-10-17 DIAGNOSIS — J441 Chronic obstructive pulmonary disease with (acute) exacerbation: Secondary | ICD-10-CM

## 2017-10-17 DIAGNOSIS — J449 Chronic obstructive pulmonary disease, unspecified: Secondary | ICD-10-CM

## 2017-10-17 DIAGNOSIS — R05 Cough: Secondary | ICD-10-CM | POA: Insufficient documentation

## 2017-10-17 DIAGNOSIS — X58XXXA Exposure to other specified factors, initial encounter: Secondary | ICD-10-CM | POA: Insufficient documentation

## 2017-10-17 DIAGNOSIS — J45909 Unspecified asthma, uncomplicated: Secondary | ICD-10-CM | POA: Diagnosis present

## 2017-10-17 DIAGNOSIS — Y998 Other external cause status: Secondary | ICD-10-CM | POA: Insufficient documentation

## 2017-10-17 DIAGNOSIS — F17210 Nicotine dependence, cigarettes, uncomplicated: Secondary | ICD-10-CM | POA: Insufficient documentation

## 2017-10-17 DIAGNOSIS — Z720 Tobacco use: Secondary | ICD-10-CM | POA: Diagnosis present

## 2017-10-17 DIAGNOSIS — Z79899 Other long term (current) drug therapy: Secondary | ICD-10-CM | POA: Insufficient documentation

## 2017-10-17 DIAGNOSIS — I10 Essential (primary) hypertension: Secondary | ICD-10-CM | POA: Insufficient documentation

## 2017-10-17 DIAGNOSIS — E039 Hypothyroidism, unspecified: Secondary | ICD-10-CM | POA: Insufficient documentation

## 2017-10-17 DIAGNOSIS — S301XXA Contusion of abdominal wall, initial encounter: Principal | ICD-10-CM | POA: Insufficient documentation

## 2017-10-17 LAB — COMPREHENSIVE METABOLIC PANEL
ALBUMIN: 4.6 g/dL (ref 3.5–5.0)
ALT: 14 U/L (ref 14–54)
ANION GAP: 12 (ref 5–15)
AST: 20 U/L (ref 15–41)
Alkaline Phosphatase: 105 U/L (ref 38–126)
BILIRUBIN TOTAL: 0.6 mg/dL (ref 0.3–1.2)
BUN: 12 mg/dL (ref 6–20)
CO2: 25 mmol/L (ref 22–32)
Calcium: 9.8 mg/dL (ref 8.9–10.3)
Chloride: 106 mmol/L (ref 101–111)
Creatinine, Ser: 0.78 mg/dL (ref 0.44–1.00)
GFR calc Af Amer: >60 mL/min (ref >60)
GFR calc non Af Amer: >60 mL/min (ref >60)
GLUCOSE: 96 mg/dL (ref 65–99)
POTASSIUM: 3.8 mmol/L (ref 3.5–5.1)
SODIUM: 143 mmol/L (ref 135–145)
Total Protein: 8.3 g/dL — ABNORMAL HIGH (ref 6.5–8.1)

## 2017-10-17 LAB — CBC
HCT: 38.4 % (ref 36.0–46.0)
HEMATOCRIT: 46.1 % — AB (ref 36.0–46.0)
HEMOGLOBIN: 15.4 g/dL — AB (ref 12.0–15.0)
Hemoglobin: 12.6 g/dL (ref 12.0–15.0)
MCH: 31.5 pg (ref 26.0–34.0)
MCH: 31 pg (ref 26.0–34.0)
MCHC: 33.4 g/dL (ref 30.0–36.0)
MCHC: 32.8 g/dL (ref 30.0–36.0)
MCV: 94.3 fL (ref 78.0–100.0)
MCV: 94.3 fL (ref 78.0–100.0)
Platelets: 360 10*3/uL (ref 150–400)
Platelets: 290 10*3/uL (ref 150–400)
RBC: 4.89 MIL/uL (ref 3.87–5.11)
RBC: 4.07 MIL/uL (ref 3.87–5.11)
RDW: 14.1 % (ref 11.5–15.5)
RDW: 14 % (ref 11.5–15.5)
WBC: 13.3 10*3/uL — ABNORMAL HIGH (ref 4.0–10.5)
WBC: 22.3 10*3/uL — ABNORMAL HIGH (ref 4.0–10.5)

## 2017-10-17 LAB — LIPASE, BLOOD: Lipase: 62 U/L — ABNORMAL HIGH (ref 11–51)

## 2017-10-17 MED ORDER — BENZONATATE 100 MG PO CAPS
200.0000 mg | ORAL_CAPSULE | Freq: Once | ORAL | Status: AC
  Administered 2017-10-17: 200 mg via ORAL
  Filled 2017-10-17: qty 2

## 2017-10-17 MED ORDER — IOPAMIDOL (ISOVUE-300) INJECTION 61%
100.0000 mL | Freq: Once | INTRAVENOUS | Status: AC | PRN
  Administered 2017-10-17: 100 mL via INTRAVENOUS

## 2017-10-17 MED ORDER — FENTANYL CITRATE (PF) 100 MCG/2ML IJ SOLN
50.0000 ug | Freq: Once | INTRAMUSCULAR | Status: AC
  Administered 2017-10-17: 50 ug via INTRAVENOUS
  Filled 2017-10-17: qty 2

## 2017-10-17 MED ORDER — SODIUM CHLORIDE 0.9 % IV BOLUS (SEPSIS)
500.0000 mL | Freq: Once | INTRAVENOUS | Status: AC
  Administered 2017-10-17: 500 mL via INTRAVENOUS

## 2017-10-17 MED ORDER — HYDROMORPHONE HCL 1 MG/ML IJ SOLN
1.0000 mg | Freq: Once | INTRAMUSCULAR | Status: AC
  Administered 2017-10-17: 1 mg via INTRAVENOUS
  Filled 2017-10-17: qty 1

## 2017-10-17 MED ORDER — ONDANSETRON HCL 4 MG/2ML IJ SOLN
4.0000 mg | Freq: Once | INTRAMUSCULAR | Status: AC
  Administered 2017-10-17: 4 mg via INTRAVENOUS
  Filled 2017-10-17: qty 2

## 2017-10-17 NOTE — ED Provider Notes (Signed)
[facility] EMERGENCY DEPARTMENT Provider Note   CSN: [id number] Arrival date & time: 10/17/17  1758     History   Chief Complaint Chief Complaint  Patient presents with  . Abdominal Pain    HPI Elizabeth [name] is a 61 y.o. female with history of back pain, HTN, hypothyroidism, and pancreatitis due to common bile duct stone who presents today with chief complaint acute onset, progressively worsening right upper quadrant abdominal pain. Patient states that for the past 2 weeks she has had a cough productive of clear-yellowish sputum and states "it felt like bronchitis ". She states that she initially did not have abdominal pain but over the course of the 2 weeks she began developing intermittent right upper quadrant pain with cough. She states earlier today she had a "coughing fit" which resulted in severe constant right upper quadrant abdominal pain. She states "it feels like my guts are going to spill out ", but she denies any palpable mass. Pain radiates from the right upper quadrant to the right lower quadrant. Pain worsens with cough and palpation. She has tried ice for her symptoms without significant relief. She denies nausea, vomiting, fevers, chills, shortness of breath, chest pain, urinary symptoms, melena, hematochezia, constipation, or diarrhea. Prior abdominal surgeries include umbilical hernia repair. Last bowel movement was earlier today and was normal for her. She is a current smoker of approximately 1 pack of cigarettes daily.  The history is provided by the patient.    Past Medical History:  Diagnosis Date  . Arthritis   . Back pain   . Hypertension   . Hypothyroidism   . Pancreatitis due to common bile duct stone 11/2012    Patient Active Problem List   Diagnosis Date Noted  . Rectus sheath hematoma, initial encounter 10/18/2017  . Leukocytosis 10/18/2017  . Varicose veins of lower extremities with other complications 20/25/4270  . Pancreatitis, acute 12/04/2012    . HTN (hypertension) 12/04/2012    Past Surgical History:  Procedure Laterality Date  . ABDOMINAL HYSTERECTOMY    . APPENDECTOMY    . COLONOSCOPY N/A 01/31/2013   Procedure: COLONOSCOPY;  Surgeon: Rogene Houston, MD;  Location: [facility];  Service: Endoscopy;  Laterality: N/A;  100  . ERCP    . HERNIA REPAIR    . Right rotator cuff repair    . TUBAL LIGATION      OB History    No data available       Home Medications    Prior to Admission medications   Medication Sig Start Date End Date Taking? Authorizing Provider  ALPRAZolam Duanne Moron) 0.5 MG tablet Take 0.5 mg by mouth daily as needed for anxiety.  12/11/14  Yes [provider]  citalopram (CELEXA) 10 MG tablet Take 10 mg by mouth daily.   Yes [provider]  lisinopril (PRINIVIL,ZESTRIL) 10 MG tablet Take 10 mg by mouth daily. 08/31/13  Yes [provider]  meloxicam (MOBIC) 7.5 MG tablet Take 7.5 mg by mouth daily as needed for pain.  03/18/15  Yes [provider]  pseudoephedrine-guaifenesin (MUCINEX D) 60-600 MG 12 hr tablet Take 1 tablet by mouth daily as needed for congestion.   Yes [provider]  azithromycin (ZITHROMAX) 250 MG tablet Take 250-500 mg by mouth See admin instructions. Take 2 tablets on day 1, then take 1 tablet on days 2 through 5    [provider]    Family History Family History  Problem Relation Age of  Onset  . Ovarian cancer Mother   . COPD Father   . Diabetes Brother   . Liver cancer Brother   . Hypertension Sister   . Mental illness Sister   . Hypertension Son   . Heart attack Maternal Grandfather   . Other Maternal Grandfather        had a trach  . Colon cancer Neg Hx     Social History Social History  Substance Use Topics  . Smoking status: Current Every Day Smoker    Packs/day: 1.00    Years: 41.00    Types: Cigarettes  . Smokeless tobacco: Never Used  . Alcohol use Yes     Comment: rarely     Allergies    Codeine   Review of Systems Review of Systems  Constitutional: Negative for chills and fever.  HENT: Positive for congestion.   Respiratory: Positive for cough. Negative for shortness of breath.   Cardiovascular: Negative for chest pain.  Gastrointestinal: Positive for abdominal pain. Negative for blood in stool, constipation, diarrhea, nausea and vomiting.  Genitourinary: Negative for decreased urine volume, dysuria, frequency, hematuria and urgency.  All other systems reviewed and are negative.    Physical Exam Updated Vital Signs BP 115/61   Pulse 64   Temp 97.7 F (36.5 C)   Resp 14   Ht 4\' 11"  (1.499 m)   Wt 72.6 kg (160 lb)   SpO2 100%   BMI 32.32 kg/m   Physical Exam  Constitutional: She appears well-developed and well-nourished. No distress.  HENT:  Head: Normocephalic and atraumatic.  Eyes: Conjunctivae are normal. Right eye exhibits no discharge. Left eye exhibits no discharge.  Neck: Normal range of motion. Neck supple. No JVD present. No tracheal deviation present.  Cardiovascular: Normal rate, regular rhythm and normal heart sounds.   Pulmonary/Chest: Effort normal. No respiratory distress. She has wheezes. She has no rales.  Diffuse expiratory wheezing anteriorly. Unable to assess breath sounds posteriorly due to pain with movement.   Abdominal: Soft. She exhibits no distension. There is tenderness. There is guarding.  Right side of the abdomen feels firm as compared to the left, but no specific mass palpated. Murphy sign absent, Rovsing's absent, no TTP at McBurney's point. Maximally tender to palpation in the right upper quadrant but with right lower quadrant, epigastric, and periumbilical pain on palpation as well  Musculoskeletal: She exhibits no edema.  Neurological: She is alert.  Skin: Skin is warm and dry. No erythema.  Psychiatric: She has a normal mood and affect. Her behavior is normal.  Nursing note and vitals reviewed.    ED Treatments /  Results  Labs (all labs ordered are listed, but only abnormal results are displayed) Labs Reviewed  LIPASE, BLOOD - Abnormal; Notable for the following:       Result Value   Lipase 62 (*)    All other components within normal limits  COMPREHENSIVE METABOLIC PANEL - Abnormal; Notable for the following:    Total Protein 8.3 (*)    All other components within normal limits  CBC - Abnormal; Notable for the following:    WBC 13.3 (*)    Hemoglobin 15.4 (*)    HCT 46.1 (*)    All other components within normal limits  URINALYSIS, ROUTINE W REFLEX MICROSCOPIC - Abnormal; Notable for the following:    Color, Urine STRAW (*)    Specific Gravity, Urine >1.046 (*)    All other components within normal limits  CBC - Abnormal; Notable  for the following:    WBC 22.3 (*)    All other components within normal limits  APTT  PROTIME-INR  CBC WITH DIFFERENTIAL/PLATELET    EKG  EKG Interpretation None       Radiology Dg Chest 2 View  Result Date: 10/17/2017 CLINICAL DATA:  Cough EXAM: CHEST  2 VIEW COMPARISON:  Chest radiograph 06/08/2013 FINDINGS: The heart size and mediastinal contours are within normal limits. Both lungs are clear. The visualized skeletal structures are unremarkable. IMPRESSION: No active cardiopulmonary disease. Electronically Signed   By: Ulyses Jarred M.D.   On: 10/17/2017 21:14   Ct Abdomen Pelvis W Contrast  Result Date: 10/17/2017 CLINICAL DATA:  Pt states coughing for two weeks and one week into coughing pt had right side abd pain and today after coughing three times pt doubled over in pain. Pt states worse with moving and breathing^161mL ISOVUE-300 IOPAMIDOL (ISOVUE-300) INJECTION 61% EXAM: CT ABDOMEN AND PELVIS WITH CONTRAST TECHNIQUE: Multidetector CT imaging of the abdomen and pelvis was performed using the standard protocol following bolus administration of intravenous contrast. CONTRAST:  156mL ISOVUE-300 IOPAMIDOL (ISOVUE-300) INJECTION 61% COMPARISON:  CT  06/08/2013 FINDINGS: Lower chest: Lung bases are clear. Hepatobiliary: No focal hepatic lesion. No biliary duct dilatation. Gallbladder is normal. Common bile duct is normal. Pancreas: Large coarse calcification the distal pancreatic duct is slightly increased in size compared to prior measuring 9 mm by 15 mm compared with 6 mm by 14 mm. The calcification appears to be within the duct. There is atrophy in the body and tail the pancreas and mild duct dilatation. There are multiple additional small calculi within the body and tail which are new from prior. No evidence of acute inflammation the pancreas. Spleen: Normal spleen Adrenals/urinary tract: Low-attenuation thickening of the medial limb of the LEFT adrenal gland has imaging characteristics most consists of a benign adenoma. Kidneys are normal. Ureters and bladder normal Stomach/Bowel: Stomach has a hiatal hernia. Small bowel cecum normal. Appendix not identified. Colon rectosigmoid colon normal. Vascular/Lymphatic: Abdominal aorta is normal caliber with atherosclerotic calcification. There is no retroperitoneal or periportal lymphadenopathy. No pelvic lymphadenopathy. Reproductive: Post hysterectomy Other: There is a large high-density mass within the RIGHT rectus abdominal muscle extending from the costal margin inferiorly to just above the iliac crest. The findings are most consistent with a rectus sheath hematoma. Hematoma extends laterally along the RIGHT chest wall superficial to the ribs of the lower chest. Hematoma measures 11 cm x 4.6 cm in axial dimension (image 35, series 2) and extends approximately 22 cm ion craniocaudad dimension. Musculoskeletal: No rib fracture associated with the rectus sheath hematoma. No cutaneous lesion identified. IMPRESSION: 1. Large RIGHT rectus sheath hematoma extending from the costal margin to the iliac crests. Note etiology identified therefore favor spontaneous hemorrhage. Recommend correlation with coagulation  status. 2. Chronic pancreatitis with large calcification in the distal pancreatic duct. Findings progressed from 2014. No evidence acute pancreatitis. Electronically Signed   By: Suzy Bouchard M.D.   On: 10/17/2017 21:17    Procedures Procedures (including critical care time)  Medications Ordered in ED Medications  ketorolac (TORADOL) 30 MG/ML injection 30 mg (not administered)  ipratropium-albuterol (DUONEB) 0.5-2.5 (3) MG/3ML nebulizer solution 3 mL (not administered)  methylPREDNISolone sodium succinate (SOLU-MEDROL) 125 mg/2 mL injection 125 mg (not administered)  fentaNYL (SUBLIMAZE) injection 50 mcg (50 mcg Intravenous Given 10/17/17 1913)  HYDROmorphone (DILAUDID) injection 1 mg (1 mg Intravenous Given 10/17/17 2024)  iopamidol (ISOVUE-300) 61 % injection 100 mL (100 mLs  Intravenous Contrast Given 10/17/17 2040)  ondansetron (ZOFRAN) injection 4 mg (4 mg Intravenous Given 10/17/17 2230)  benzonatate (TESSALON) capsule 200 mg (200 mg Oral Given 10/17/17 2305)  sodium chloride 0.9 % bolus 500 mL (0 mLs Intravenous Stopped 10/18/17 0029)     Initial Impression / Assessment and Plan / ED Course  I have reviewed the triage vital signs and the nursing notes.  Pertinent labs & imaging results that were available during my care of the patient were reviewed by me and considered in my medical decision making (see chart for details).     Patient with right-sided abdominal pain secondary to persistent cough. Afebrile. Chest x-ray reviewed by me shows no evidence of acute cardiopulmonary abnormality such as pneumonia or pleural effusion. CT scan of the abdomen reviewed by me shows large right sided rectus sheath hematoma extending from the costal margin to the iliac crests. This is consistent with the abdominal wall rigidity felt on the right side on examination. Initial labwork unremarkable, her lipase is marginally elevated but not consistent with acute pancreatitis. Pain management while  in the ED, and patient states Dilaudid was very helpful for her pain on reevaluation. While in the ED, patient's blood pressure consistently lowered and at times patient was hypotensive. Repeat CBC shows a nearly 3 point drop in hemoglobin over the course of 4 hours. Do not feel comfortable sending the patient home with such a precipitous drop. Spoke with hospitalist Dr. Olevia Bowens who agrees to assume care of the patient and bring her into the hospital for further observation and management. Patient seen and evaluated by Dr. Lita Mains who agrees with assessment and plan.  Final Clinical Impressions(s) / ED Diagnoses   Final diagnoses:  Hematoma of rectus sheath, initial encounter  Significant drop in hemoglobin [facility])    New Prescriptions New Prescriptions   No medications on file     Renita Papa, PA-C 10/18/17 0101    Julianne Rice, MD 10/21/17 1335

## 2017-10-17 NOTE — ED Notes (Signed)
Pt is stating that pain is not changed and is still significant. States medication is just making her head feel funny

## 2017-10-17 NOTE — ED Notes (Signed)
Patient transported to CT 

## 2017-10-17 NOTE — ED Notes (Signed)
Pt is still yelling out in pain. Have notified RN and will notify PA as well

## 2017-10-17 NOTE — ED Triage Notes (Signed)
Pt states coughing for two weeks and one week into coughing pt had right side abd pain and today after coughing three times pt doubled over in pain. Pt states worse with moving and breathing.

## 2017-10-18 ENCOUNTER — Encounter (HOSPITAL_COMMUNITY): Payer: Self-pay | Admitting: Internal Medicine

## 2017-10-18 DIAGNOSIS — J410 Simple chronic bronchitis: Secondary | ICD-10-CM

## 2017-10-18 DIAGNOSIS — J449 Chronic obstructive pulmonary disease, unspecified: Secondary | ICD-10-CM

## 2017-10-18 DIAGNOSIS — Z720 Tobacco use: Secondary | ICD-10-CM

## 2017-10-18 DIAGNOSIS — J40 Bronchitis, not specified as acute or chronic: Secondary | ICD-10-CM

## 2017-10-18 DIAGNOSIS — S3011XA Contusion of abdominal wall, initial encounter: Secondary | ICD-10-CM | POA: Diagnosis present

## 2017-10-18 DIAGNOSIS — E039 Hypothyroidism, unspecified: Secondary | ICD-10-CM | POA: Diagnosis present

## 2017-10-18 DIAGNOSIS — I10 Essential (primary) hypertension: Secondary | ICD-10-CM

## 2017-10-18 DIAGNOSIS — D72829 Elevated white blood cell count, unspecified: Secondary | ICD-10-CM | POA: Diagnosis present

## 2017-10-18 DIAGNOSIS — J441 Chronic obstructive pulmonary disease with (acute) exacerbation: Secondary | ICD-10-CM

## 2017-10-18 DIAGNOSIS — J45909 Unspecified asthma, uncomplicated: Secondary | ICD-10-CM | POA: Diagnosis present

## 2017-10-18 DIAGNOSIS — S301XXA Contusion of abdominal wall, initial encounter: Secondary | ICD-10-CM | POA: Diagnosis present

## 2017-10-18 LAB — CBC WITH DIFFERENTIAL/PLATELET
BASOS ABS: 0 10*3/uL (ref 0.0–0.1)
BASOS PCT: 0 %
EOS ABS: 0 10*3/uL (ref 0.0–0.7)
Eosinophils Relative: 0 %
HEMATOCRIT: 37 % (ref 36.0–46.0)
HEMOGLOBIN: 12.2 g/dL (ref 12.0–15.0)
Lymphocytes Relative: 8 %
Lymphs Abs: 1.3 10*3/uL (ref 0.7–4.0)
MCH: 31.4 pg (ref 26.0–34.0)
MCHC: 33 g/dL (ref 30.0–36.0)
MCV: 95.1 fL (ref 78.0–100.0)
Monocytes Absolute: 0.4 10*3/uL (ref 0.1–1.0)
Monocytes Relative: 2 %
NEUTROS ABS: 14.9 10*3/uL — AB (ref 1.7–7.7)
NEUTROS PCT: 90 %
Platelets: 304 10*3/uL (ref 150–400)
RBC: 3.89 MIL/uL (ref 3.87–5.11)
RDW: 14.2 % (ref 11.5–15.5)
WBC: 16.7 10*3/uL — ABNORMAL HIGH (ref 4.0–10.5)

## 2017-10-18 LAB — URINALYSIS, ROUTINE W REFLEX MICROSCOPIC
Bilirubin Urine: NEGATIVE
Glucose, UA: NEGATIVE mg/dL
Hgb urine dipstick: NEGATIVE
Ketones, ur: NEGATIVE mg/dL
LEUKOCYTES UA: NEGATIVE
NITRITE: NEGATIVE
PH: 6 (ref 5.0–8.0)
Protein, ur: NEGATIVE mg/dL

## 2017-10-18 LAB — TYPE AND SCREEN
ABO/RH(D): B POS
Antibody Screen: NEGATIVE

## 2017-10-18 LAB — TSH: TSH: 1.193 u[IU]/mL (ref 0.350–4.500)

## 2017-10-18 LAB — PROTIME-INR
INR: 0.87
PROTHROMBIN TIME: 11.7 s (ref 11.4–15.2)

## 2017-10-18 LAB — APTT: APTT: 25 s (ref 24–36)

## 2017-10-18 MED ORDER — HYDROMORPHONE HCL 1 MG/ML IJ SOLN: 0.5000 mg | INTRAMUSCULAR | Status: DC | PRN

## 2017-10-18 MED ORDER — ONDANSETRON HCL 4 MG/2ML IJ SOLN: 4.0000 mg | Freq: Four times a day (QID) | INTRAMUSCULAR | Status: DC | PRN

## 2017-10-18 MED ORDER — OXYCODONE-ACETAMINOPHEN 5-325 MG PO TABS
1.0000 | ORAL_TABLET | ORAL | Status: DC | PRN
  Administered 2017-10-18: 1 via ORAL
  Filled 2017-10-18: qty 1

## 2017-10-18 MED ORDER — PANTOPRAZOLE SODIUM 40 MG PO TBEC
40.0000 mg | DELAYED_RELEASE_TABLET | Freq: Every day | ORAL | Status: DC
  Administered 2017-10-18 – 2017-10-19 (×2): 40 mg via ORAL
  Filled 2017-10-18 (×3): qty 1

## 2017-10-18 MED ORDER — GUAIFENESIN ER 600 MG PO TB12
600.0000 mg | ORAL_TABLET | Freq: Two times a day (BID) | ORAL | Status: DC
  Administered 2017-10-18 (×2): 600 mg via ORAL
  Filled 2017-10-18 (×2): qty 1

## 2017-10-18 MED ORDER — SODIUM CHLORIDE 0.9 % IV SOLN: INTRAVENOUS | Status: DC

## 2017-10-18 MED ORDER — IPRATROPIUM-ALBUTEROL 0.5-2.5 (3) MG/3ML IN SOLN
3.0000 mL | Freq: Four times a day (QID) | RESPIRATORY_TRACT | Status: DC
  Administered 2017-10-18 – 2017-10-19 (×3): 3 mL via RESPIRATORY_TRACT
  Filled 2017-10-18 (×3): qty 3

## 2017-10-18 MED ORDER — KETOROLAC TROMETHAMINE 30 MG/ML IJ SOLN
30.0000 mg | Freq: Four times a day (QID) | INTRAMUSCULAR | Status: DC
  Administered 2017-10-18: 30 mg via INTRAVENOUS
  Filled 2017-10-18: qty 1

## 2017-10-18 MED ORDER — MAGNESIUM SULFATE 2 GM/50ML IV SOLN
2.0000 g | Freq: Once | INTRAVENOUS | Status: AC
  Administered 2017-10-18: 2 g via INTRAVENOUS
  Filled 2017-10-18: qty 50

## 2017-10-18 MED ORDER — GUAIFENESIN-DM 100-10 MG/5ML PO SYRP
5.0000 mL | ORAL_SOLUTION | Freq: Four times a day (QID) | ORAL | Status: DC
  Administered 2017-10-18 – 2017-10-19 (×5): 5 mL via ORAL
  Filled 2017-10-18 (×5): qty 5

## 2017-10-18 MED ORDER — LEVOFLOXACIN IN D5W 500 MG/100ML IV SOLN
500.0000 mg | Freq: Once | INTRAVENOUS | Status: AC
  Administered 2017-10-18: 500 mg via INTRAVENOUS
  Filled 2017-10-18: qty 100

## 2017-10-18 MED ORDER — ACETAMINOPHEN 325 MG PO TABS: 650.0000 mg | ORAL_TABLET | Freq: Four times a day (QID) | ORAL | Status: DC | PRN

## 2017-10-18 MED ORDER — SODIUM CHLORIDE 0.45 % IV SOLN
INTRAVENOUS | Status: DC
  Administered 2017-10-18: 02:00:00 via INTRAVENOUS

## 2017-10-18 MED ORDER — IPRATROPIUM-ALBUTEROL 0.5-2.5 (3) MG/3ML IN SOLN: 3.0000 mL | Freq: Four times a day (QID) | RESPIRATORY_TRACT | Status: DC | PRN

## 2017-10-18 MED ORDER — METHYLPREDNISOLONE SODIUM SUCC 125 MG IJ SOLR
125.0000 mg | Freq: Once | INTRAMUSCULAR | Status: AC
  Administered 2017-10-18: 125 mg via INTRAVENOUS
  Filled 2017-10-18: qty 2

## 2017-10-18 MED ORDER — BENZONATATE 100 MG PO CAPS: 100.0000 mg | ORAL_CAPSULE | Freq: Four times a day (QID) | ORAL | Status: DC | PRN

## 2017-10-18 MED ORDER — NICOTINE 21 MG/24HR TD PT24: 21.0000 mg | MEDICATED_PATCH | Freq: Every day | TRANSDERMAL | Status: DC | PRN

## 2017-10-18 MED ORDER — IBUPROFEN 400 MG PO TABS
200.0000 mg | ORAL_TABLET | Freq: Three times a day (TID) | ORAL | Status: DC
  Administered 2017-10-18 – 2017-10-19 (×3): 200 mg via ORAL
  Filled 2017-10-18 (×3): qty 1

## 2017-10-18 MED ORDER — OXYCODONE-ACETAMINOPHEN 5-325 MG PO TABS: 1.0000 | ORAL_TABLET | ORAL | Status: DC | PRN

## 2017-10-18 MED ORDER — CITALOPRAM HYDROBROMIDE 20 MG PO TABS
10.0000 mg | ORAL_TABLET | Freq: Every day | ORAL | Status: DC
  Administered 2017-10-18 – 2017-10-19 (×2): 10 mg via ORAL
  Filled 2017-10-18 (×2): qty 1

## 2017-10-18 MED ORDER — KETOROLAC TROMETHAMINE 30 MG/ML IJ SOLN
30.0000 mg | Freq: Once | INTRAMUSCULAR | Status: AC
  Administered 2017-10-18: 30 mg via INTRAVENOUS
  Filled 2017-10-18: qty 1

## 2017-10-18 MED ORDER — IPRATROPIUM-ALBUTEROL 0.5-2.5 (3) MG/3ML IN SOLN
3.0000 mL | Freq: Once | RESPIRATORY_TRACT | Status: AC
  Administered 2017-10-18: 3 mL via RESPIRATORY_TRACT
  Filled 2017-10-18: qty 3

## 2017-10-18 MED ORDER — ALPRAZOLAM 0.5 MG PO TABS: 0.5000 mg | ORAL_TABLET | Freq: Every day | ORAL | Status: DC | PRN

## 2017-10-18 MED ORDER — LEVOFLOXACIN 500 MG PO TABS: 500.0000 mg | ORAL_TABLET | Freq: Every day | ORAL | Status: DC

## 2017-10-18 NOTE — H&P (Signed)
History and Physical    Elizabeth [name] XNA:[id number] DOB: 1956-12-16 DOA: 10/17/2017  PCP: Redmond School, MD   Patient coming from: Home.  I have personally briefly reviewed patient's old medical records in [place]  Chief Complaint: Abdominal pain.  HPI: Elizabeth [name] is a 61 y.o. female with medical history significant of osteoarthritis, chronic back pain, hypertension, history of gallstone pancreatitis, hypothyroidism not on levothyroxine, tobacco use who is coming to the emergency department with complaints of abdominal pain since about 1700 of 10/17/2017.  Per patient, she has been battling "chest congestion" with persistent cough of occasionally productive light yellowish sputum associated with wheezing and dyspnea for the past 2 weeks. About a week ago, the patient developed right-sided pain which got worse with coughing. She subsequently saw her provider who prescribed her Zithromax that she just finished yesterday. She has also been taking over-the-counter Mucinex D without significant relief. Today, around 1700 chief cough 3 times very hard and immediately after this felt significant worsening of her right-sided abdominal pain so she decided to come to the ED. She mentions recurrent arthralgias and back pain on review of systems, for which she takes meloxicam 7.5 mg as needed daily. She denies fever, chills, palpitations, dizziness, diaphoresis or pitting edema of the lower extremities. No nausea, emesis, diarrhea, melena or hematochezia. Denies GU symptoms. No polyuria, polydipsia or blurred vision.  ED Course: Initial vital signs temperature 98.1F, pulse 85, respirations 20, blood pressure 144/128 mmHg and O2 sat 99% on room air.  Her workup showed an initial WBC of 13.3, hemoglobin 15.4 and platelets 360. Follow-up WBC was 22.3, hemoglobin 12.6 g/dL and platelets 290. PT/INR was normal. Her urinalysis showed an elevated specific gravity at 1.046, but was otherwise  unremarkable. Her CMP showed a total protein at 8.3 g/dL, but was otherwise normal. Lipase level was 62.  Imaging: Chest radiograph did not show any acute cardiopulmonary pathology. CT scan of the abdomen/pelvis with contrast showed a large right rectus sheath hematoma extending from the costal margin to the iliac crests. Also chronic pancreatitis with large calcification distal pancreatic duct. These latter findings seem to have progressed since 2014. Please see images and full radiology report for further details.  ED medications: She was given Tessalon Perle 200 mg by mouth 1, Zofran 4 mg IVP 1, fentanyl 50 g IVP 1, hydromorphone 1 mg IVP 1 and a 500 mL normal saline bolus.    Review of Systems: As per HPI otherwise 10 point review of systems negative.    Past Medical History:  Diagnosis Date  . Arthritis   . Back pain   . Hypertension   . Hypothyroidism   . Pancreatitis due to common bile duct stone 11/2012    Past Surgical History:  Procedure Laterality Date  . ABDOMINAL HYSTERECTOMY    . APPENDECTOMY    . COLONOSCOPY N/A 01/31/2013   Procedure: COLONOSCOPY;  Surgeon: Rogene Houston, MD;  Location: [facility];  Service: Endoscopy;  Laterality: N/A;  100  . ERCP    . HERNIA REPAIR    . Right rotator cuff repair    . TUBAL LIGATION       reports that she has been smoking Cigarettes.  She has a 41.00 pack-year smoking history. She has never used smokeless tobacco. She reports that she drinks alcohol. She reports that she does not use drugs.  Allergies  Allergen Reactions  . Codeine Itching and Other (See Comments)    Restlessness  Family History  Problem Relation Age of Onset  . Ovarian cancer Mother   . COPD Father   . Diabetes Brother   . Liver cancer Brother   . Hypertension Sister   . Mental illness Sister   . Hypertension Son   . Heart attack Maternal Grandfather   . Other Maternal Grandfather        had a trach  . Colon cancer Neg Hx     Prior  to Admission medications   Medication Sig Start Date End Date Taking? Authorizing Provider  ALPRAZolam Duanne Moron) 0.5 MG tablet Take 0.5 mg by mouth daily as needed for anxiety.  12/11/14  Yes [provider]  citalopram (CELEXA) 10 MG tablet Take 10 mg by mouth daily.   Yes [provider]  lisinopril (PRINIVIL,ZESTRIL) 10 MG tablet Take 10 mg by mouth daily. 08/31/13  Yes [provider]  meloxicam (MOBIC) 7.5 MG tablet Take 7.5 mg by mouth daily as needed for pain.  03/18/15  Yes [provider]  pseudoephedrine-guaifenesin (MUCINEX D) 60-600 MG 12 hr tablet Take 1 tablet by mouth daily as needed for congestion.   Yes [provider]  azithromycin (ZITHROMAX) 250 MG tablet Take 250-500 mg by mouth See admin instructions. Take 2 tablets on day 1, then take 1 tablet on days 2 through 5    [provider]    Physical Exam: Vitals:   10/17/17 2200 10/17/17 2230 10/17/17 2330 10/18/17 0030  BP: (!) 99/54 100/84 (!) 110/50 115/61  Pulse: 68 66 65 64  Resp: 17 17 17 14   Temp:      TempSrc:      SpO2: 93% 96% 97% 100%  Weight:      Height:        Constitutional: NAD, calm, comfortable Eyes: PERRL, lids and conjunctivae normal ENMT: Mucous membranes are moist. Posterior pharynx clear of any exudate or lesions.Normal dentition.  Neck: normal, supple, no masses, no thyromegaly Respiratory: Decreased breath sounds in bases. Mild rhonchi and bilateral wheezing, no crackles. Normal respiratory effort. No accessory muscle use.  Cardiovascular: Regular rate and rhythm, no murmurs / rubs / gallops. No extremity edema. 2+ pedal pulses. No carotid bruits.  Abdomen: Nondistended. Bowel sounds positive. Right quadrants tension along the right rectus abdominal muscle with right-sided quadrants and epigastric tenderness. No hepatosplenomegaly. Musculoskeletal: no clubbing / cyanosis. Good ROM, no contractures. Normal muscle tone.  Skin: no significant  rashes, lesions, ulcers on limited skin exam. Neurologic: CN 2-12 grossly intact. Sensation intact, DTR normal. Strength 5/5 in all 4.  Psychiatric: Normal judgment and insight. Alert and oriented x 4. Normal mood.    Labs on Admission: I have personally reviewed following labs and imaging studies  CBC:  Recent Labs Lab 10/17/17 1921 10/17/17 2317  WBC 13.3* 22.3*  HGB 15.4* 12.6  HCT 46.1* 38.4  MCV 94.3 94.3  PLT 360 027   Basic Metabolic Panel:  Recent Labs Lab 10/17/17 1921  NA 143  K 3.8  CL 106  CO2 25  GLUCOSE 96  BUN 12  CREATININE 0.78  CALCIUM 9.8   GFR: Estimated Creatinine Clearance: 62.5 mL/min (by C-G formula based on SCr of 0.78 mg/dL). Liver Function Tests:  Recent Labs Lab 10/17/17 1921  AST 20  ALT 14  ALKPHOS 105  BILITOT 0.6  PROT 8.3*  ALBUMIN 4.6    Recent Labs Lab 10/17/17 1921  LIPASE 62*   No results for input(s): AMMONIA in the last 168 hours. Coagulation  Profile:  Recent Labs Lab 10/17/17 1921  INR 0.87   Cardiac Enzymes: No results for input(s): CKTOTAL, CKMB, CKMBINDEX, TROPONINI in the last 168 hours. BNP (last 3 results) No results for input(s): PROBNP in the last 8760 hours. HbA1C: No results for input(s): HGBA1C in the last 72 hours. CBG: No results for input(s): GLUCAP in the last 168 hours. Lipid Profile: No results for input(s): CHOL, HDL, LDLCALC, TRIG, CHOLHDL, LDLDIRECT in the last 72 hours. Thyroid Function Tests: No results for input(s): TSH, T4TOTAL, FREET4, T3FREE, THYROIDAB in the last 72 hours. Anemia Panel: No results for input(s): VITAMINB12, FOLATE, FERRITIN, TIBC, IRON, RETICCTPCT in the last 72 hours. Urine analysis:    Component Value Date/Time   COLORURINE STRAW (A) 10/17/2017 2330   APPEARANCEUR CLEAR 10/17/2017 2330   LABSPEC >1.046 (H) 10/17/2017 2330   PHURINE 6.0 10/17/2017 2330   GLUCOSEU NEGATIVE 10/17/2017 2330   HGBUR NEGATIVE 10/17/2017 2330   BILIRUBINUR NEGATIVE  10/17/2017 2330   KETONESUR NEGATIVE 10/17/2017 2330   PROTEINUR NEGATIVE 10/17/2017 2330   UROBILINOGEN 0.2 06/08/2013 1115   NITRITE NEGATIVE 10/17/2017 2330   LEUKOCYTESUR NEGATIVE 10/17/2017 2330    Radiological Exams on Admission: Dg Chest 2 View  Result Date: 10/17/2017 CLINICAL DATA:  Cough EXAM: CHEST  2 VIEW COMPARISON:  Chest radiograph 06/08/2013 FINDINGS: The heart size and mediastinal contours are within normal limits. Both lungs are clear. The visualized skeletal structures are unremarkable. IMPRESSION: No active cardiopulmonary disease. Electronically Signed   By: Ulyses Jarred M.D.   On: 10/17/2017 21:14   Ct Abdomen Pelvis W Contrast  Result Date: 10/17/2017 CLINICAL DATA:  Pt states coughing for two weeks and one week into coughing pt had right side abd pain and today after coughing three times pt doubled over in pain. Pt states worse with moving and breathing^141mL ISOVUE-300 IOPAMIDOL (ISOVUE-300) INJECTION 61% EXAM: CT ABDOMEN AND PELVIS WITH CONTRAST TECHNIQUE: Multidetector CT imaging of the abdomen and pelvis was performed using the standard protocol following bolus administration of intravenous contrast. CONTRAST:  160mL ISOVUE-300 IOPAMIDOL (ISOVUE-300) INJECTION 61% COMPARISON:  CT 06/08/2013 FINDINGS: Lower chest: Lung bases are clear. Hepatobiliary: No focal hepatic lesion. No biliary duct dilatation. Gallbladder is normal. Common bile duct is normal. Pancreas: Large coarse calcification the distal pancreatic duct is slightly increased in size compared to prior measuring 9 mm by 15 mm compared with 6 mm by 14 mm. The calcification appears to be within the duct. There is atrophy in the body and tail the pancreas and mild duct dilatation. There are multiple additional small calculi within the body and tail which are new from prior. No evidence of acute inflammation the pancreas. Spleen: Normal spleen Adrenals/urinary tract: Low-attenuation thickening of the medial limb of  the LEFT adrenal gland has imaging characteristics most consists of a benign adenoma. Kidneys are normal. Ureters and bladder normal Stomach/Bowel: Stomach has a hiatal hernia. Small bowel cecum normal. Appendix not identified. Colon rectosigmoid colon normal. Vascular/Lymphatic: Abdominal aorta is normal caliber with atherosclerotic calcification. There is no retroperitoneal or periportal lymphadenopathy. No pelvic lymphadenopathy. Reproductive: Post hysterectomy Other: There is a large high-density mass within the RIGHT rectus abdominal muscle extending from the costal margin inferiorly to just above the iliac crest. The findings are most consistent with a rectus sheath hematoma. Hematoma extends laterally along the RIGHT chest wall superficial to the ribs of the lower chest. Hematoma measures 11 cm x 4.6 cm in axial dimension (image 35, series 2) and extends approximately 22 cm  ion craniocaudad dimension. Musculoskeletal: No rib fracture associated with the rectus sheath hematoma. No cutaneous lesion identified. IMPRESSION: 1. Large RIGHT rectus sheath hematoma extending from the costal margin to the iliac crests. Note etiology identified therefore favor spontaneous hemorrhage. Recommend correlation with coagulation status. 2. Chronic pancreatitis with large calcification in the distal pancreatic duct. Findings progressed from 2014. No evidence acute pancreatitis. Electronically Signed   By: Suzy Bouchard M.D.   On: 10/17/2017 21:17    EKG: Independently reviewed   Assessment/Plan Principal Problem:   Rectus sheath hematoma, initial encounter Observation/telemetry. Continue analgesics as needed. Toradol 30 mg IVP every 6 hours 2 days. Monitor hematocrit and hemoglobin. Check type and screen later today. Transfuse if needed. Consider evaluation by general surgery or IR later today.  Active Problems:   Bronchitis due to tobacco use ([place]) The patient just finished Zithromax pack without  significant results. I will give a one-time dose of Levaquin IV and then start Levaquin 500 mg by mouth daily. She has decided to quit smoking yesterday. Nicotine replacement therapy has been ordered. Tobacco cessation information to be provided.    Reactive airway disease DuoNeb every 6 hours as needed. Single dose Solu-Medrol 125 mg IVP 1 dose. Tessalon Perles 100 mg every 6 hours when necessary. Mucinex 600 mg by mouth twice a day. The patient should also get cough relief PRN oxycodone and/or  hydromorphone    HTN (hypertension) Per patient, she has frequent dry cough. She mentions that the cough was bothersome enough, that she  wanted to ask her PCP for an antihypertensive medication change. I will discontinue lisinopril. Monitor blood pressure. Consider starting losartan 25 mg by mouth daily instead if not hypotensive.    Leukocytosis Quick increase from earlier today likely due to stress. No fever or chills. Follow-up WBC.    Hypothyroidism Currently not taking levothyroxine. Check TSH later today.     DVT prophylaxis: SCDs. Code Status: Full code. Family Communication:  Disposition Plan: Observation for pain control and bronchitis/reactive airways treatment. Consults called:  Admission status: Observation/Telemetry.   Reubin Milan MD [facility] Pager [phone].  If 7PM-7AM, please contact night-coverage [email or web address] Password [email or web address]  10/18/2017, 1:20 AM

## 2017-10-18 NOTE — Progress Notes (Signed)
PROGRESS NOTE    Elizabeth [name]  YWV:[id number] DOB: 11-24-1956 DOA: 10/17/2017 PCP: Redmond School, MD    Brief Narrative:  61 yo female who presented with abdominal pain. Does have significant past medical history for osteoarthritis, chronic back pain, hypertension, hypothyroidism and tobacco abuse. She complained of worsening right-sided abdominal pain, for last 7 days, with acute worsening over last 8 hours, after a severe coughing spell.  She had dyspnea, cough and increase yellowish sputum production for last 2 weeks, she has been treated as an outpatient with azithromycin and Mucinex.  On initial physical examination, blood pressure 100/84, heart rate 66, respiratory rate 17, oxygen saturation 96%. Her mucous membranes were moist, her lungs had decreased breath sounds at bases bilaterally with mild rhonchi and wheezing, no rales, heart S1-S2 present, rhythmic, no gallops, rubs or murmurs, the abdomen was distended, tender to palpation at the right quadrants, no lower extremity edema. Sodium 143, potassium 3.8, chloride 106, bicarbonate 25, glucose 96, BUN 12, creatinine is 0.78, white count 13.3, hemoglobin 15.4, hematocrit 46.1, platelets 360, INR 0.87, PTT 25. Urinalysis negative for infection. CT of the abdomen with a large rectus sheath hematoma extending from the costal margin to the iliac crests. Chest x-ray was negative for infiltrates.   Patient was admitted to the hospital with working diagnosis of abdominal pain due to spontaneous large rectus sheath hematoma.   Assessment & Plan:   Principal Problem:   Rectus sheath hematoma, initial encounter Active Problems:   HTN (hypertension)   Leukocytosis   Hypothyroidism   Reactive airway disease   Bronchitis due to tobacco use ([facility])   1. Large right rectus sheet hematoma. Will continue pain control with ibuprofen, acetaminophen and oxycodone, follow cell count in am. Avoid anticoagulants, and continue cough suppressive therapy.    2. COPD exacerbation. Improved with bronchodilator therapy, will hold on systemic steroids, will change antitussive therapy to scheduled guiafenesin dm. Hold on levofloxacin.    3. HTN. Continue blood pressure monitoring, systolic 854 to 627, will continue to hold on lisinopril for now, to prevent hypotension.   4. Hypothyroidism. Continue levothyroxine po, per home regimen.   5. Depression. Will continue citalopram and alprazolam.    DVT prophylaxis: scd  Code Status: full Family Communication: I spoke with patient's family at the beside and all questions were addressed Disposition Plan: home   Consultants:     Procedures:     Antimicrobials:       Subjective: Patient with persistent pain on her right side, worse with movement and cough, no radiation, improved with analgesics, no nausea or vomiting. Positive dry cough and dyspnea, no wheezing.   Objective: Vitals:   10/18/17 0030 10/18/17 0110 10/18/17 0135 10/18/17 0600  BP: 115/61 (!) 120/45  (!) 100/52  Pulse: 64 61  69  Resp: 14 18  17   Temp:  98.3 F (36.8 C)  98.2 F (36.8 C)  TempSrc:  Oral  Oral  SpO2: 100% 98% 94% 93%  Weight:  73 kg (160 lb 15 oz)    Height:  4\' 11"  (1.499 m)      Intake/Output Summary (Last 24 hours) at 10/18/17 0740 Last data filed at 10/18/17 0156  Gross per 24 hour  Intake              500 ml  Output              100 ml  Net  400 ml   Filed Weights   10/17/17 1829 10/18/17 0110  Weight: 72.6 kg (160 lb) 73 kg (160 lb 15 oz)    Examination:   General: deconditioned and in pain.  Neurology: Awake and alert, non focal  E ENT: mild pallor, no icterus, oral mucosa moist Cardiovascular: No JVD. S1-S2 present, rhythmic, no gallops, rubs, or murmurs. No lower extremity edema. Pulmonary: decreased breath sounds bilaterally, adequate air movement, no wheezing. Scattered rhonchi and rales. Gastrointestinal. Abdomen flat, no organomegaly, non tender, no rebound or  guarding Skin. No rashes Musculoskeletal: no joint deformities     Data Reviewed: I have personally reviewed following labs and imaging studies  CBC:  Recent Labs Lab 10/17/17 1921 10/17/17 2317 10/18/17 0356  WBC 13.3* 22.3* 16.7*  NEUTROABS  --   --  14.9*  HGB 15.4* 12.6 12.2  HCT 46.1* 38.4 37.0  MCV 94.3 94.3 95.1  PLT 360 290 235   Basic Metabolic Panel:  Recent Labs Lab 10/17/17 1921  NA 143  K 3.8  CL 106  CO2 25  GLUCOSE 96  BUN 12  CREATININE 0.78  CALCIUM 9.8   GFR: Estimated Creatinine Clearance: 62.6 mL/min (by C-G formula based on SCr of 0.78 mg/dL). Liver Function Tests:  Recent Labs Lab 10/17/17 1921  AST 20  ALT 14  ALKPHOS 105  BILITOT 0.6  PROT 8.3*  ALBUMIN 4.6    Recent Labs Lab 10/17/17 1921  LIPASE 62*   No results for input(s): AMMONIA in the last 168 hours. Coagulation Profile:  Recent Labs Lab 10/17/17 1921  INR 0.87   Cardiac Enzymes: No results for input(s): CKTOTAL, CKMB, CKMBINDEX, TROPONINI in the last 168 hours. BNP (last 3 results) No results for input(s): PROBNP in the last 8760 hours. HbA1C: No results for input(s): HGBA1C in the last 72 hours. CBG: No results for input(s): GLUCAP in the last 168 hours. Lipid Profile: No results for input(s): CHOL, HDL, LDLCALC, TRIG, CHOLHDL, LDLDIRECT in the last 72 hours. Thyroid Function Tests:  Recent Labs  10/18/17 0356  TSH 1.193   Anemia Panel: No results for input(s): VITAMINB12, FOLATE, FERRITIN, TIBC, IRON, RETICCTPCT in the last 72 hours.    Radiology Studies: I have reviewed all of the imaging during this hospital visit personally     Scheduled Meds: . citalopram  10 mg Oral Daily  . guaiFENesin  600 mg Oral BID  . ketorolac  30 mg Intravenous Q6H  . levofloxacin  500 mg Oral Daily  . pantoprazole  40 mg Oral Daily   Continuous Infusions: . sodium chloride 100 mL/hr at 10/18/17 0156     LOS: 0 days        Tawni Millers, MD [facility] Pager [phone]

## 2017-10-18 NOTE — Progress Notes (Signed)
Patient c/o "heart racing" while in bed getting breathing tx. HR was 89. SpO2 decreased to 92% on RA, so RRT applied O2 2L [place]. Patient shaky while resting in bed, but states that she feels a little better. VS per flowsheet. Patient offered Xanax as ordered, but refused, stating that a job wouldn't let her work if she took Xanax. Granddaughter in room with patient. Light dimmed, door closed, guided imagery encouraged. Will continue to monitor.

## 2017-10-18 NOTE — Evaluation (Signed)
Physical Therapy Evaluation Patient Details Name: Elizabeth [name] MRN: [id number] DOB: [birth year] Today's Date: 10/18/2017   History of Present Illness  Elizabeth [name] is a 61 y.o. female with medical history significant of osteoarthritis, chronic back pain, hypertension, history of gallstone pancreatitis, hypothyroidism not on levothyroxine, tobacco use who is coming to the emergency department with complaints of abdominal pain since about 1700 of 10/17/2017.\   Clinical Impression  Patient demonstrates good return for log rolling to side and sitting up from side lying position to avoid increasing abdominal pain, had occasional use of side rails in hallway during ambulation and required encouragement to let arms swing when walking.  Patient will benefit from continued physical therapy in hospital and recommended venue below to increase strength, balance, endurance for safe ADLs and gait.    Follow Up Recommendations Outpatient PT;Supervision - Intermittent    Equipment Recommendations  None recommended by PT    Recommendations for Other Services       Precautions / Restrictions Precautions Precautions: Fall Precaution Comments: right sided rectus sheath hematoma Restrictions Weight Bearing Restrictions: No      Mobility  Bed Mobility Overal bed mobility: Needs Assistance Bed Mobility: Rolling;Sidelying to Sit;Sit to Sidelying Rolling: Min guard Sidelying to sit: Min guard     Sit to sidelying: Min guard General bed mobility comments: Patient demonstrates good return for rolling to side and sitting up from sidelying position with verbal/tacile cueing  Transfers Overall transfer level: Needs assistance Equipment used: None Transfers: Sit to/from [employer] Transfers Sit to Stand: Supervision Stand pivot transfers: Supervision          Ambulation/Gait Ambulation/Gait assistance: Supervision Ambulation Distance (Feet): 100 Feet Assistive device: None Gait  Pattern/deviations: WFL(Within Functional Limits)   Gait velocity interpretation: Below normal speed for age/gender General Gait Details: demonstrates slightly labored cadence with tendency to hold arms across chest, encouraged to let arms swing at sides with good carry over  Stairs            Wheelchair Mobility    Modified Rankin (Stroke Patients Only)       Balance Overall balance assessment: Needs assistance Sitting-balance support: Feet supported;No upper extremity supported Sitting balance-Leahy Scale: Good     Standing balance support: No upper extremity supported;During functional activity Standing balance-Leahy Scale: Fair                               Pertinent Vitals/Pain Pain Assessment: 0-10 Pain Score: 4  Pain Location: right side of abdomen Pain Descriptors / Indicators: Sharp;Discomfort Pain Intervention(s): Limited activity within patient's tolerance;Monitored during session    Martinez expects to be discharged to:: Private residence Living Arrangements: Alone Available Help at Discharge: Family Type of Home: House Home Access: Stairs to enter Entrance Stairs-Rails: Right Entrance Stairs-Number of Steps: 3 Home Layout: One level Home Equipment: Environmental consultant - 2 wheels;Cane - single point;Shower seat      Prior Function Level of Independence: Independent               Hand Dominance        Extremity/Trunk Assessment   Upper Extremity Assessment Upper Extremity Assessment: Overall WFL for tasks assessed    Lower Extremity Assessment Lower Extremity Assessment: Overall WFL for tasks assessed    Cervical / Trunk Assessment Cervical / Trunk Assessment: Normal  Communication   Communication: No difficulties  Cognition Arousal/Alertness: Awake/alert Behavior During Therapy: WFL for tasks assessed/performed  Overall Cognitive Status: Within Functional Limits for tasks assessed                                         General Comments      Exercises General Exercises - Lower Extremity Ankle Circles/Pumps: Seated;AROM;Strengthening;Both;5 reps Long Arc Quad: Seated;AROM;Strengthening;Both;5 reps Hip Flexion/Marching: Seated;AROM;Strengthening;Both;5 reps   Assessment/Plan    PT Assessment Patient needs continued PT services  PT Problem List Decreased activity tolerance;Decreased mobility;Decreased strength;Decreased balance;Pain       PT Treatment Interventions Gait training;Functional mobility training;Stair training;Therapeutic activities;Therapeutic exercise;Patient/family education    PT Goals (Current goals can be found in the Care Plan section)  Acute Rehab PT Goals Patient Stated Goal: return home PT Goal Formulation: With patient/family Time For Goal Achievement: 10/21/17 Potential to Achieve Goals: Good    Frequency Min 3X/week   Barriers to discharge        Co-evaluation               AM-PAC PT "6 Clicks" Daily Activity  Outcome Measure Difficulty turning over in bed (including adjusting bedclothes, sheets and blankets)?: A Little Difficulty moving from lying on back to sitting on the side of the bed? : A Little Difficulty sitting down on and standing up from a chair with arms (e.g., wheelchair, bedside commode, etc,.)?: None Help needed moving to and from a bed to chair (including a wheelchair)?: None Help needed walking in hospital room?: None Help needed climbing 3-5 steps with a railing? : A Little 6 Click Score: 21    End of Session   Activity Tolerance: Patient tolerated treatment well Patient left: in chair;with call bell/phone within reach;with family/visitor present Nurse Communication: Mobility status PT Visit Diagnosis: Unsteadiness on feet (R26.81);Other abnormalities of gait and mobility (R26.89);Muscle weakness (generalized) (M62.81)    Time: 9528-4132 PT Time Calculation (min) (ACUTE ONLY): 31 min   Charges:   PT  Evaluation $PT Eval Low Complexity: 1 Low PT Treatments $Therapeutic Activity: 8-22 mins   PT G Codes:   PT G-Codes **NOT FOR INPATIENT CLASS** Functional Assessment Tool Used: AM-PAC 6 Clicks Basic Mobility Functional Limitation: Mobility: Walking and moving around Mobility: Walking and Moving Around Current Status (G4010): At least 20 percent but less than 40 percent impaired, limited or restricted Mobility: Walking and Moving Around Goal Status [phone]): At least 20 percent but less than 40 percent impaired, limited or restricted Mobility: Walking and Moving Around Discharge Status [phone]): At least 20 percent but less than 40 percent impaired, limited or restricted    2:56 PM, 10/18/17 [name], MPT Physical Therapist with [facility] [phone] office [phone] mobile phone

## 2017-10-18 NOTE — Progress Notes (Signed)
PROGRESS NOTE  I saw and evaluated the patient, I personally obtain the key portions of the history and physical exam, I reviewed the physician assistant student documentation and agree with the physician assistant student medical decision-making. Further assessment and plan are as follows:  Please see attending's daily progress note.  Mauricio Arrien M.D.  ZANYIA SILBAUGH VZD:[id number] DOB: 10/12/1956 DOA: 10/17/2017 PCP: [employer], MD   LOS: 0 days   Brief Narrative / Interim history: 61 year-old female with 2-week history of productive cough presents with abdominal pain precipitated by coughing. She states that one week ago she began to feel a tightness in her right mid-abdomen following coughing. Yesterday evening, she coughed forcefully three times and felt an acute onset of right-sided abdominal pain of greater intensity than previous. She came to the ED and was found to have a right abdominal mass. CT scan showed a right rectus sheath hematoma. Pt is not on AC or antiplatelets and denies personal/family history of coagulopathy. During the patient's stay in the ED, serial CBCs demonstrated a drop in hemoglobin from 15.4 at 19:21 to 12.6 at 23:17, raising concerns for continuing blood loss. She was thus admitted for  with a working diagnosis of rectus sheath hematoma.   Assessment & Plan: Principal Problem:   Rectus sheath hematoma, initial encounter Active Problems:   HTN (hypertension)   Leukocytosis   Hypothyroidism   Reactive airway disease   Bronchitis due to tobacco use (HCC)   Rectus sheath hematoma In the setting of recent cough and no history of coagulopathy or AC/antiplatlet use, hematoma likely due to mechanical shear of blood vessel 2/2 cough. Hemoglobin initially dropped from 15.4 to 12.6 over the course of a 4 hours, but remained stable overnight. Last hemoglobin was 12.2 at 03:56 today. Pt without dizziness, weakness, or SOB. Doubt anemia or further decrease in  hemoglobin at this time. Abdominal pain still provoked by cough. Will treat pain with percocet, ibuprofen, and tylenol. Will recheck CBC in the morning.  Cough secondary to bronchitis and reactive airway disease Pt with cough productive of yellow/green sputum for the past two weeks. No fevers or chills.. Pt with 40 pack-year smoking history. PCP has raised concerns for COPD, but pt has not been formally evaluated with PFT. Pt with wheezing and rhonchi on presentation and treated with DuoNebs, IV steroids, Tessalon, Mucinex, and levaquin. Notably, pt was treated with z-pack recently without effect. There may be an element of COPD contributing to the cough, though the pt is not currently in exacerbation. Will attempt aggressive control of cough in order to help with abdominal pain. Will give Robitussin and duonebs on a scheduled basis. No wheezing currently, so no further role for steroids at this time.   Hypertension Stable. BP 100/52 this morning. Pt was on lisinopril at home and endorsed some dry cough before this current episode that may be ACEI-associated. Lisinopril was discontinued. Will continue to monitor BP daily.   Leukocytosis WBC count increased acutely last night, likely represents stress response. No fevers, or chills. Will recheck CBC in the morning.     DVT prophylaxis: SCDs Code Status: full code Family Communication: family at bedside, all questions addressed Disposition Plan: home  Consultants:     Procedures:     Antimicrobials:  Levaquin 500 mg IV  Subjective: Pt is feeling some better, states that pain "not too bad" when she's not coughing. Pain is severe with coughing. No dyspnea, dizziness, or fatigue.   Objective: Vitals:   10/18/17  0030 10/18/17 0110 10/18/17 0135 10/18/17 0600  BP: 115/61 (!) 120/45  (!) 100/52  Pulse: 64 61  69  Resp: 14 18  17   Temp:  98.3 F (36.8 C)  98.2 F (36.8 C)  TempSrc:  Oral  Oral  SpO2: 100% 98% 94% 93%  Weight:  73 kg  (160 lb 15 oz)    Height:  4\' 11"  (1.499 m)      Intake/Output Summary (Last 24 hours) at 10/18/17 1042 Last data filed at 10/18/17 0700  Gross per 24 hour  Intake          1126.67 ml  Output              200 ml  Net           926.67 ml   Filed Weights   10/17/17 1829 10/18/17 0110  Weight: 72.6 kg (160 lb) 73 kg (160 lb 15 oz)    Examination:  Constitutional: well-developed, well nourished, mildly ill-appearing Eyes: lids and conjunctivae normal ENMT: Mucous membranes are moist.  Neck: normal, supple, no masses, no thyromegaly Respiratory: clear to auscultation bilaterally, no wheezing, no crackles. Normal respiratory effort. No accessory muscle use.  Cardiovascular: Regular rate and rhythm, no murmurs / rubs / gallops. No LE edema. 2+ pedal pulses. Abdomen: Right-sided tenderness with mass. Bowel sounds positive.  Musculoskeletal: no clubbing / cyanosis. No joint deformity upper and lower extremities. No contractures. Normal muscle tone.  Skin: no rashes, lesions, ulcers.  Neurologic: Grossly non-focal  Psychiatric: Normal judgment and insight. Alert and oriented x 3. Normal mood.    Data Reviewed: I have independently reviewed following labs and imaging studies   CBC:  Recent Labs Lab 10/17/17 1921 10/17/17 2317 10/18/17 0356  WBC 13.3* 22.3* 16.7*  NEUTROABS  --   --  14.9*  HGB 15.4* 12.6 12.2  HCT 46.1* 38.4 37.0  MCV 94.3 94.3 95.1  PLT 360 290 295   Basic Metabolic Panel:  Recent Labs Lab 10/17/17 1921  NA 143  K 3.8  CL 106  CO2 25  GLUCOSE 96  BUN 12  CREATININE 0.78  CALCIUM 9.8   GFR: Estimated Creatinine Clearance: 62.6 mL/min (by C-G formula based on SCr of 0.78 mg/dL). Liver Function Tests:  Recent Labs Lab 10/17/17 1921  AST 20  ALT 14  ALKPHOS 105  BILITOT 0.6  PROT 8.3*  ALBUMIN 4.6    Recent Labs Lab 10/17/17 1921  LIPASE 62*   No results for input(s): AMMONIA in the last 168 hours. Coagulation Profile:  Recent  Labs Lab 10/17/17 1921  INR 0.87   Cardiac Enzymes: No results for input(s): CKTOTAL, CKMB, CKMBINDEX, TROPONINI in the last 168 hours. BNP (last 3 results) No results for input(s): PROBNP in the last 8760 hours. HbA1C: No results for input(s): HGBA1C in the last 72 hours. CBG: No results for input(s): GLUCAP in the last 168 hours. Lipid Profile: No results for input(s): CHOL, HDL, LDLCALC, TRIG, CHOLHDL, LDLDIRECT in the last 72 hours. Thyroid Function Tests:  Recent Labs  10/18/17 0356  TSH 1.193   Anemia Panel: No results for input(s): VITAMINB12, FOLATE, FERRITIN, TIBC, IRON, RETICCTPCT in the last 72 hours. Urine analysis:    Component Value Date/Time   COLORURINE STRAW (A) 10/17/2017 2330   APPEARANCEUR CLEAR 10/17/2017 2330   LABSPEC >1.046 (H) 10/17/2017 2330   PHURINE 6.0 10/17/2017 2330   GLUCOSEU NEGATIVE 10/17/2017 2330   HGBUR NEGATIVE 10/17/2017 [place] 10/17/2017 2330  KETONESUR NEGATIVE 10/17/2017 2330   PROTEINUR NEGATIVE 10/17/2017 2330   UROBILINOGEN 0.2 06/08/2013 1115   NITRITE NEGATIVE 10/17/2017 2330   LEUKOCYTESUR NEGATIVE 10/17/2017 2330   Sepsis Labs: Invalid input(s): PROCALCITONIN, LACTICIDVEN  No results found for this or any previous visit (from the past 240 hour(s)).    Radiology Studies: Dg Chest 2 View  Result Date: 10/17/2017 CLINICAL DATA:  Cough EXAM: CHEST  2 VIEW COMPARISON:  Chest radiograph 06/08/2013 FINDINGS: The heart size and mediastinal contours are within normal limits. Both lungs are clear. The visualized skeletal structures are unremarkable. IMPRESSION: No active cardiopulmonary disease. Electronically Signed   By: Ulyses Jarred M.D.   On: 10/17/2017 21:14   Ct Abdomen Pelvis W Contrast  Result Date: 10/17/2017 CLINICAL DATA:  Pt states coughing for two weeks and one week into coughing pt had right side abd pain and today after coughing three times pt doubled over in pain. Pt states worse with  moving and breathing^165mL ISOVUE-300 IOPAMIDOL (ISOVUE-300) INJECTION 61% EXAM: CT ABDOMEN AND PELVIS WITH CONTRAST TECHNIQUE: Multidetector CT imaging of the abdomen and pelvis was performed using the standard protocol following bolus administration of intravenous contrast. CONTRAST:  132mL ISOVUE-300 IOPAMIDOL (ISOVUE-300) INJECTION 61% COMPARISON:  CT 06/08/2013 FINDINGS: Lower chest: Lung bases are clear. Hepatobiliary: No focal hepatic lesion. No biliary duct dilatation. Gallbladder is normal. Common bile duct is normal. Pancreas: Large coarse calcification the distal pancreatic duct is slightly increased in size compared to prior measuring 9 mm by 15 mm compared with 6 mm by 14 mm. The calcification appears to be within the duct. There is atrophy in the body and tail the pancreas and mild duct dilatation. There are multiple additional small calculi within the body and tail which are new from prior. No evidence of acute inflammation the pancreas. Spleen: Normal spleen Adrenals/urinary tract: Low-attenuation thickening of the medial limb of the LEFT adrenal gland has imaging characteristics most consists of a benign adenoma. Kidneys are normal. Ureters and bladder normal Stomach/Bowel: Stomach has a hiatal hernia. Small bowel cecum normal. Appendix not identified. Colon rectosigmoid colon normal. Vascular/Lymphatic: Abdominal aorta is normal caliber with atherosclerotic calcification. There is no retroperitoneal or periportal lymphadenopathy. No pelvic lymphadenopathy. Reproductive: Post hysterectomy Other: There is a large high-density mass within the RIGHT rectus abdominal muscle extending from the costal margin inferiorly to just above the iliac crest. The findings are most consistent with a rectus sheath hematoma. Hematoma extends laterally along the RIGHT chest wall superficial to the ribs of the lower chest. Hematoma measures 11 cm x 4.6 cm in axial dimension (image 35, series 2) and extends approximately  22 cm ion craniocaudad dimension. Musculoskeletal: No rib fracture associated with the rectus sheath hematoma. No cutaneous lesion identified. IMPRESSION: 1. Large RIGHT rectus sheath hematoma extending from the costal margin to the iliac crests. Note etiology identified therefore favor spontaneous hemorrhage. Recommend correlation with coagulation status. 2. Chronic pancreatitis with large calcification in the distal pancreatic duct. Findings progressed from 2014. No evidence acute pancreatitis. Electronically Signed   By: Suzy Bouchard M.D.   On: 10/17/2017 21:17     Scheduled Meds: . citalopram  10 mg Oral Daily  . guaiFENesin-dextromethorphan  5 mL Oral Q6H  . ibuprofen  200 mg Oral TID  . ipratropium-albuterol  3 mL Nebulization QID  . pantoprazole  40 mg Oral Daily   Continuous Infusions:     Time spent: 15 inutes   Audery Amel, PA-S 10/18/2017, 10:42 AM   @[email or web address] @

## 2017-10-19 DIAGNOSIS — D72829 Elevated white blood cell count, unspecified: Secondary | ICD-10-CM

## 2017-10-19 DIAGNOSIS — E039 Hypothyroidism, unspecified: Secondary | ICD-10-CM

## 2017-10-19 LAB — CBC
HCT: 36.8 % (ref 36.0–46.0)
Hemoglobin: 12 g/dL (ref 12.0–15.0)
MCH: 31 pg (ref 26.0–34.0)
MCHC: 32.6 g/dL (ref 30.0–36.0)
MCV: 95.1 fL (ref 78.0–100.0)
PLATELETS: 320 10*3/uL (ref 150–400)
RBC: 3.87 MIL/uL (ref 3.87–5.11)
RDW: 14.6 % (ref 11.5–15.5)
WBC: 16.8 10*3/uL — AB (ref 4.0–10.5)

## 2017-10-19 LAB — BASIC METABOLIC PANEL
ANION GAP: 8 (ref 5–15)
BUN: 18 mg/dL (ref 6–20)
CALCIUM: 8.7 mg/dL — AB (ref 8.9–10.3)
CHLORIDE: 107 mmol/L (ref 101–111)
CO2: 26 mmol/L (ref 22–32)
Creatinine, Ser: 0.77 mg/dL (ref 0.44–1.00)
GFR calc non Af Amer: >60 mL/min (ref >60)
GLUCOSE: 88 mg/dL (ref 65–99)
Potassium: 3.8 mmol/L (ref 3.5–5.1)
Sodium: 141 mmol/L (ref 135–145)

## 2017-10-19 LAB — CBC WITH DIFFERENTIAL/PLATELET
BASOS ABS: 0 10*3/uL (ref 0.0–0.1)
BASOS PCT: 0 %
Eosinophils Absolute: 0 10*3/uL (ref 0.0–0.7)
Eosinophils Relative: 0 %
HEMATOCRIT: 33.9 % — AB (ref 36.0–46.0)
HEMOGLOBIN: 11.3 g/dL — AB (ref 12.0–15.0)
LYMPHS PCT: 20 %
Lymphs Abs: 3.6 10*3/uL (ref 0.7–4.0)
MCH: 31.1 pg (ref 26.0–34.0)
MCHC: 33.3 g/dL (ref 30.0–36.0)
MCV: 93.4 fL (ref 78.0–100.0)
MONO ABS: 1.1 10*3/uL — AB (ref 0.1–1.0)
MONOS PCT: 6 %
NEUTROS ABS: 13.5 10*3/uL — AB (ref 1.7–7.7)
NEUTROS PCT: 74 %
Platelets: 287 10*3/uL (ref 150–400)
RBC: 3.63 MIL/uL — ABNORMAL LOW (ref 3.87–5.11)
RDW: 14.3 % (ref 11.5–15.5)
WBC: 18.3 10*3/uL — ABNORMAL HIGH (ref 4.0–10.5)

## 2017-10-19 MED ORDER — ALBUTEROL SULFATE (2.5 MG/3ML) 0.083% IN NEBU: 2.5000 mg | INHALATION_SOLUTION | Freq: Four times a day (QID) | RESPIRATORY_TRACT | 12 refills | Status: DC | PRN

## 2017-10-19 MED ORDER — GUAIFENESIN-DM 100-10 MG/5ML PO SYRP: 5.0000 mL | ORAL_SOLUTION | Freq: Four times a day (QID) | ORAL | 0 refills | Status: DC

## 2017-10-19 MED ORDER — IPRATROPIUM-ALBUTEROL 0.5-2.5 (3) MG/3ML IN SOLN: 3.0000 mL | Freq: Four times a day (QID) | RESPIRATORY_TRACT | Status: DC | PRN

## 2017-10-19 MED ORDER — OXYCODONE-ACETAMINOPHEN 5-325 MG PO TABS: 1.0000 | ORAL_TABLET | Freq: Three times a day (TID) | ORAL | 0 refills | Status: DC | PRN

## 2017-10-19 NOTE — Progress Notes (Signed)
Physical Therapy Treatment Patient Details Name: Elizabeth [name] MRN: [id number] DOB: 1956/10/24 Today's Date: 10/19/2017    History of Present Illness Elizabeth [name] is a 61 y.o. female with medical history significant of osteoarthritis, chronic back pain, hypertension, history of gallstone pancreatitis, hypothyroidism not on levothyroxine, tobacco use who is coming to the emergency department with complaints of abdominal pain since about 1700 of 10/17/2017.    PT Comments    Patient presents in chair and agreeable for therapy.  Patient states she has completed HEP at least 2 times this AM, only c/o minor right sided abdominal discomfort usually when walking and demonstrates good return for going up/down stairs in stairwell.  Patient will benefit from continued physical therapy in hospital and recommended venue below to increase strength, balance, endurance for safe ADLs and gait.    Follow Up Recommendations  Outpatient PT;Supervision - Intermittent     Equipment Recommendations  None recommended by PT    Recommendations for Other Services       Precautions / Restrictions Precautions Precautions: Fall Precaution Comments: right sided rectus sheath hematoma Restrictions Weight Bearing Restrictions: No    Mobility  Bed Mobility               General bed mobility comments: Patient presents up in chair  Transfers Overall transfer level: Independent Equipment used: None                Ambulation/Gait Ambulation/Gait assistance: Supervision Ambulation Distance (Feet): 150 Feet Assistive device: None Gait Pattern/deviations: WFL(Within Functional Limits)   Gait velocity interpretation: at or above normal speed for age/gender General Gait Details: Requires occasional verbal cues to let arms swing when walking, occasional use of side rails, no loss of balance   Stairs Stairs: Yes   Stair Management: One rail Right;One rail Left;Alternating pattern Number of  Stairs: 9 General stair comments: used right rail going up, left rail coming down, good return demonstrated with no loss of balance  Wheelchair Mobility    Modified Rankin (Stroke Patients Only)       Balance Overall balance assessment: Independent                                          Cognition Arousal/Alertness: Awake/alert Behavior During Therapy: WFL for tasks assessed/performed Overall Cognitive Status: Within Functional Limits for tasks assessed                                        Exercises      General Comments        Pertinent Vitals/Pain Pain Score: 2  Pain Location: right side of abdomen Pain Intervention(s): Limited activity within patient's tolerance;Monitored during session    Home Living                      Prior Function            PT Goals (current goals can now be found in the care plan section) Acute Rehab PT Goals Patient Stated Goal: return home PT Goal Formulation: With patient/family Time For Goal Achievement: 10/21/17 Potential to Achieve Goals: Good Progress towards PT goals: Progressing toward goals    Frequency    Min 3X/week      PT Plan Current plan remains appropriate  Co-evaluation              AM-PAC PT "6 Clicks" Daily Activity  Outcome Measure  Difficulty turning over in bed (including adjusting bedclothes, sheets and blankets)?: None Difficulty moving from lying on back to sitting on the side of the bed? : None Difficulty sitting down on and standing up from a chair with arms (e.g., wheelchair, bedside commode, etc,.)?: None Help needed moving to and from a bed to chair (including a wheelchair)?: None Help needed walking in hospital room?: None Help needed climbing 3-5 steps with a railing? : None 6 Click Score: 24    End of Session   Activity Tolerance: Patient tolerated treatment well Patient left: in chair;with call bell/phone within reach Nurse  Communication: Mobility status PT Visit Diagnosis: Unsteadiness on feet (R26.81);Other abnormalities of gait and mobility (R26.89);Muscle weakness (generalized) (M62.81)     Time: 1012-1030 PT Time Calculation (min) (ACUTE ONLY): 18 min  Charges:  $Gait Training: 8-22 mins                    G Codes:  Functional Assessment Tool Used: AM-PAC 6 Clicks Basic Mobility Functional Limitation: Mobility: Walking and moving around Mobility: Walking and Moving Around Current Status (O8325): 0 percent impaired, limited or restricted Mobility: Walking and Moving Around Goal Status (Q9826): 0 percent impaired, limited or restricted Mobility: Walking and Moving Around Discharge Status [phone]): 0 percent impaired, limited or restricted    1:23 PM, 10/19/17 [name], MPT Physical Therapist with [facility] [phone] office [phone] mobile phone

## 2017-10-19 NOTE — Discharge Summary (Signed)
Physician Discharge Summary  Elizabeth [name] HAL:[id number] DOB: 12/09/1956 DOA: 10/17/2017  PCP: Redmond School, MD  Admit date: 10/17/2017 Discharge date: 10/19/2017  Admitted From: Home Disposition: Home  Recommendations for Outpatient Follow-up:  1. Follow up with PCP in 1-2 weeks 2. Please obtain BMP/CBC in one week  Home Health: Equipment/Devices: Nebulizer  Discharge Condition: Stable CODE STATUS: Full code Diet recommendation: Heart Healthy   Brief/Interim Summary: 61 year old female with a history of chronic back pain, hypertension, hypothyroidism, presented to the hospital with complaints of abdominal pain.  She was recently having chest congestion, cough and shortness of breath for approximately 2 weeks.  She developed right-sided abdominal pain which is worse with coughing.  CT scan of the emergency room indicated a right-sided rectal sheath hematoma.  It was noted that she her blood pressure was mildly low and her hemoglobin had dropped 3 g in approximately 4 hours.  Patient was admitted to the hospital for further observation.  She was also treated for COPD exacerbation with 1 dose of IV steroids and continue bronchodilators.  From a respiratory standpoint, she appears to have improved.  She is breathing comfortably on room air and does not have any evidence of wheezing.  She will continue on bronchodilators at home.  She has already completed a course of antibiotics as an outpatient.  Since she is not wheezing, we will not prescribe any further steroids.  Her case was reviewed with Dr. Arnoldo Morale, on-call for general surgery and no surgical intervention was recommended at this time for hematoma.  Her hemoglobin has stabilized at approximately 12.  She still has some soreness, but appears to be stable.  She should have a repeat CBC in the next week.  Patient otherwise feels well and is ready for discharge.  Discharge Diagnoses:  Principal Problem:   Rectus sheath hematoma, initial  encounter Active Problems:   HTN (hypertension)   Leukocytosis   Hypothyroidism   Reactive airway disease   Bronchitis due to tobacco use (HCC)   Chronic obstructive pulmonary disease with acute exacerbation [facility])    Discharge Instructions  Discharge Instructions    DME Nebulizer machine    Complete by:  As directed    Patient needs a nebulizer to treat with the following condition:  COPD (chronic obstructive pulmonary disease) ([place])   Diet - low sodium heart healthy    Complete by:  As directed    Increase activity slowly    Complete by:  As directed      Allergies as of 10/19/2017      Reactions   Codeine Itching, Other (See Comments)   Restlessness       Medication List    STOP taking these medications   azithromycin 250 MG tablet Commonly known as:  ZITHROMAX   lisinopril 10 MG tablet Commonly known as:  PRINIVIL,ZESTRIL   meloxicam 7.5 MG tablet Commonly known as:  MOBIC   pseudoephedrine-guaifenesin 60-600 MG 12 hr tablet Commonly known as:  MUCINEX D     TAKE these medications   albuterol (2.5 MG/3ML) 0.083% nebulizer solution Commonly known as:  PROVENTIL Take 3 mLs (2.5 mg total) by nebulization every 6 (six) hours as needed for wheezing or shortness of breath.   ALPRAZolam 0.5 MG tablet Commonly known as:  XANAX Take 0.5 mg by mouth daily as needed for anxiety.   citalopram 10 MG tablet Commonly known as:  CELEXA Take 10 mg by mouth daily.   guaiFENesin-dextromethorphan 100-10 MG/5ML syrup Commonly known as:  ROBITUSSIN DM Take 5 mLs by mouth every 6 (six) hours.   oxyCODONE-acetaminophen 5-325 MG tablet Commonly known as:  PERCOCET/ROXICET Take 1 tablet by mouth every 8 (eight) hours as needed for severe pain.            Durable Medical Equipment        Start     Ordered   10/19/17 0000  DME Nebulizer machine    Question:  Patient needs a nebulizer to treat with the following condition  Answer:  COPD (chronic obstructive  pulmonary disease) ([place])   10/19/17 1529      Allergies  Allergen Reactions  . Codeine Itching and Other (See Comments)    Restlessness     Consultations:     Procedures/Studies: Dg Chest 2 View  Result Date: 10/17/2017 CLINICAL DATA:  Cough EXAM: CHEST  2 VIEW COMPARISON:  Chest radiograph 06/08/2013 FINDINGS: The heart size and mediastinal contours are within normal limits. Both lungs are clear. The visualized skeletal structures are unremarkable. IMPRESSION: No active cardiopulmonary disease. Electronically Signed   By: Ulyses Jarred M.D.   On: 10/17/2017 21:14   Ct Abdomen Pelvis W Contrast  Result Date: 10/17/2017 CLINICAL DATA:  Pt states coughing for two weeks and one week into coughing pt had right side abd pain and today after coughing three times pt doubled over in pain. Pt states worse with moving and breathing^137mL ISOVUE-300 IOPAMIDOL (ISOVUE-300) INJECTION 61% EXAM: CT ABDOMEN AND PELVIS WITH CONTRAST TECHNIQUE: Multidetector CT imaging of the abdomen and pelvis was performed using the standard protocol following bolus administration of intravenous contrast. CONTRAST:  1108mL ISOVUE-300 IOPAMIDOL (ISOVUE-300) INJECTION 61% COMPARISON:  CT 06/08/2013 FINDINGS: Lower chest: Lung bases are clear. Hepatobiliary: No focal hepatic lesion. No biliary duct dilatation. Gallbladder is normal. Common bile duct is normal. Pancreas: Large coarse calcification the distal pancreatic duct is slightly increased in size compared to prior measuring 9 mm by 15 mm compared with 6 mm by 14 mm. The calcification appears to be within the duct. There is atrophy in the body and tail the pancreas and mild duct dilatation. There are multiple additional small calculi within the body and tail which are new from prior. No evidence of acute inflammation the pancreas. Spleen: Normal spleen Adrenals/urinary tract: Low-attenuation thickening of the medial limb of the LEFT adrenal gland has imaging  characteristics most consists of a benign adenoma. Kidneys are normal. Ureters and bladder normal Stomach/Bowel: Stomach has a hiatal hernia. Small bowel cecum normal. Appendix not identified. Colon rectosigmoid colon normal. Vascular/Lymphatic: Abdominal aorta is normal caliber with atherosclerotic calcification. There is no retroperitoneal or periportal lymphadenopathy. No pelvic lymphadenopathy. Reproductive: Post hysterectomy Other: There is a large high-density mass within the RIGHT rectus abdominal muscle extending from the costal margin inferiorly to just above the iliac crest. The findings are most consistent with a rectus sheath hematoma. Hematoma extends laterally along the RIGHT chest wall superficial to the ribs of the lower chest. Hematoma measures 11 cm x 4.6 cm in axial dimension (image 35, series 2) and extends approximately 22 cm ion craniocaudad dimension. Musculoskeletal: No rib fracture associated with the rectus sheath hematoma. No cutaneous lesion identified. IMPRESSION: 1. Large RIGHT rectus sheath hematoma extending from the costal margin to the iliac crests. Note etiology identified therefore favor spontaneous hemorrhage. Recommend correlation with coagulation status. 2. Chronic pancreatitis with large calcification in the distal pancreatic duct. Findings progressed from 2014. No evidence acute pancreatitis. Electronically Signed   By: Helane Gunther.D.  On: 10/17/2017 21:17       Subjective: Still has some soreness right abdomen.  Shortness of breath appears to be improved.  Discharge Exam: Vitals:   10/19/17 0650 10/19/17 0934  BP:    Pulse: 73   Resp:    Temp:    SpO2: 98% 97%   Vitals:   10/18/17 2055 10/19/17 0500 10/19/17 0650 10/19/17 0934  BP: (!) 118/50 (!) 119/52    Pulse: 92 73 73   Resp: 16 16    Temp: 98.7 F (37.1 C) 98.6 F (37 C)    TempSrc: Oral Oral    SpO2: 94% 98% 98% 97%  Weight:      Height:        General: Pt is alert, awake, not in  acute distress Cardiovascular: RRR, S1/S2 +, no rubs, no gallops Respiratory: CTA bilaterally, no wheezing, no rhonchi Abdominal: Soft, tender in right abdomen, ND, bowel sounds + Extremities: no edema, no cyanosis    The results of significant diagnostics from this hospitalization (including imaging, microbiology, ancillary and laboratory) are listed below for reference.     Microbiology: No results found for this or any previous visit (from the past 240 hour(s)).   Labs: BNP (last 3 results) No results for input(s): BNP in the last 8760 hours. Basic Metabolic Panel:  Recent Labs Lab 10/17/17 1921 10/19/17 0618  NA 143 141  K 3.8 3.8  CL 106 107  CO2 25 26  GLUCOSE 96 88  BUN 12 18  CREATININE 0.78 0.77  CALCIUM 9.8 8.7*   Liver Function Tests:  Recent Labs Lab 10/17/17 1921  AST 20  ALT 14  ALKPHOS 105  BILITOT 0.6  PROT 8.3*  ALBUMIN 4.6    Recent Labs Lab 10/17/17 1921  LIPASE 62*   No results for input(s): AMMONIA in the last 168 hours. CBC:  Recent Labs Lab 10/17/17 1921 10/17/17 2317 10/18/17 0356 10/19/17 0618 10/19/17 1437  WBC 13.3* 22.3* 16.7* 18.3* 16.8*  NEUTROABS  --   --  14.9* 13.5*  --   HGB 15.4* 12.6 12.2 11.3* 12.0  HCT 46.1* 38.4 37.0 33.9* 36.8  MCV 94.3 94.3 95.1 93.4 95.1  PLT 360 290 304 287 320   Cardiac Enzymes: No results for input(s): CKTOTAL, CKMB, CKMBINDEX, TROPONINI in the last 168 hours. BNP: Invalid input(s): POCBNP CBG: No results for input(s): GLUCAP in the last 168 hours. D-Dimer No results for input(s): DDIMER in the last 72 hours. Hgb A1c No results for input(s): HGBA1C in the last 72 hours. Lipid Profile No results for input(s): CHOL, HDL, LDLCALC, TRIG, CHOLHDL, LDLDIRECT in the last 72 hours. Thyroid function studies  Recent Labs  10/18/17 0356  TSH 1.193   Anemia work up No results for input(s): VITAMINB12, FOLATE, FERRITIN, TIBC, IRON, RETICCTPCT in the last 72 hours. Urinalysis     Component Value Date/Time   COLORURINE STRAW (A) 10/17/2017 2330   APPEARANCEUR CLEAR 10/17/2017 2330   LABSPEC >1.046 (H) 10/17/2017 2330   PHURINE 6.0 10/17/2017 2330   GLUCOSEU NEGATIVE 10/17/2017 2330   HGBUR NEGATIVE 10/17/2017 2330   BILIRUBINUR NEGATIVE 10/17/2017 2330   KETONESUR NEGATIVE 10/17/2017 2330   PROTEINUR NEGATIVE 10/17/2017 2330   UROBILINOGEN 0.2 06/08/2013 1115   NITRITE NEGATIVE 10/17/2017 2330   LEUKOCYTESUR NEGATIVE 10/17/2017 2330   Sepsis Labs Invalid input(s): PROCALCITONIN,  WBC,  LACTICIDVEN Microbiology No results found for this or any previous visit (from the past 240 hour(s)).   Time coordinating discharge: Over  30 minutes  SIGNED:   Kathie Dike, MD  [facility] 10/19/2017, 3:32 PM Pager   If 7PM-7AM, please contact night-coverage [email or web address] Password [email or web address]

## 2017-10-19 NOTE — Care Management Note (Signed)
Case Management Note  Patient Details  Name: Elizabeth [name] MRN: [id number] Date of Birth: Mar 11, 1956  Subjective/Objective:          Adm with rectus sheath hematoma due to coughing episodes. Recent antibiotic treatment for bronchitis prior to admission. She is from home alone. Recently laid off in July, does not have insurance. Financial counselor will talk with patient in regards to Medicaid potential. She has a PCP, drives herself to appointments.      Action/Plan: Anticipate DC home with self care. Declines OP PT recommendation.   Expected Discharge Date:       10/19/2017           Expected Discharge Plan:  Home/Self Care  In-House Referral:     Discharge planning Services  CM Consult  Post Acute Care Choice:  NA Choice offered to:  NA  DME Arranged:    DME Agency:     HH Arranged:    HH Agency:     Status of Service:  Completed, signed off  If discussed at [employer] of Stay Meetings, dates discussed:    Additional Comments:  Shannie Kontos, Chauncey Reading, RN 10/19/2017, 2:40 PM

## 2017-10-20 LAB — HIV ANTIBODY (ROUTINE TESTING W REFLEX): HIV Screen 4th Generation wRfx: NONREACTIVE

## 2017-10-20 NOTE — Care Management (Signed)
Pt discharged 10/19/17 with neb ordered but never received. CM called pt and she does not have the est $40 that the neb machine would cost. CM referred her to [place], [facility] rep, who will call pt and give neb machine at charity.

## 2019-07-18 DIAGNOSIS — Z20828 Contact with and (suspected) exposure to other viral communicable diseases: Secondary | ICD-10-CM | POA: Diagnosis not present

## 2019-07-20 ENCOUNTER — Other Ambulatory Visit: Payer: Self-pay

## 2019-07-20 DIAGNOSIS — Z20822 Contact with and (suspected) exposure to covid-19: Secondary | ICD-10-CM

## 2019-07-22 LAB — NOVEL CORONAVIRUS, NAA: SARS-CoV-2, NAA: NOT DETECTED

## 2019-11-14 ENCOUNTER — Other Ambulatory Visit: Payer: Self-pay

## 2020-03-28 DIAGNOSIS — M25562 Pain in left knee: Secondary | ICD-10-CM | POA: Diagnosis not present

## 2020-06-17 ENCOUNTER — Other Ambulatory Visit (HOSPITAL_COMMUNITY): Payer: Self-pay | Admitting: Internal Medicine

## 2020-06-17 DIAGNOSIS — Z1231 Encounter for screening mammogram for malignant neoplasm of breast: Secondary | ICD-10-CM

## 2020-06-25 ENCOUNTER — Ambulatory Visit (HOSPITAL_COMMUNITY)
Admission: RE | Admit: 2020-06-25 | Discharge: 2020-06-25 | Disposition: A | Discharge status: Home / Self Care | Payer: PPO | Source: Ambulatory Visit | Attending: Internal Medicine | Admitting: Internal Medicine

## 2020-06-25 ENCOUNTER — Other Ambulatory Visit: Payer: Self-pay

## 2020-06-25 DIAGNOSIS — Z1231 Encounter for screening mammogram for malignant neoplasm of breast: Secondary | ICD-10-CM | POA: Diagnosis not present

## 2020-06-27 DIAGNOSIS — M1990 Unspecified osteoarthritis, unspecified site: Secondary | ICD-10-CM | POA: Diagnosis not present

## 2020-06-27 DIAGNOSIS — K861 Other chronic pancreatitis: Secondary | ICD-10-CM | POA: Diagnosis not present

## 2020-06-27 DIAGNOSIS — I10 Essential (primary) hypertension: Secondary | ICD-10-CM | POA: Diagnosis not present

## 2020-06-27 DIAGNOSIS — Z0000 Encounter for general adult medical examination without abnormal findings: Secondary | ICD-10-CM | POA: Diagnosis not present

## 2020-06-27 DIAGNOSIS — E063 Autoimmune thyroiditis: Secondary | ICD-10-CM | POA: Diagnosis not present

## 2020-06-27 DIAGNOSIS — Z0001 Encounter for general adult medical examination with abnormal findings: Secondary | ICD-10-CM | POA: Diagnosis not present

## 2020-06-27 DIAGNOSIS — R2689 Other abnormalities of gait and mobility: Secondary | ICD-10-CM | POA: Diagnosis not present

## 2020-06-27 DIAGNOSIS — Z1389 Encounter for screening for other disorder: Secondary | ICD-10-CM | POA: Diagnosis not present

## 2020-06-27 DIAGNOSIS — R42 Dizziness and giddiness: Secondary | ICD-10-CM | POA: Diagnosis not present

## 2020-06-27 DIAGNOSIS — I872 Venous insufficiency (chronic) (peripheral): Secondary | ICD-10-CM | POA: Diagnosis not present

## 2020-06-27 DIAGNOSIS — J449 Chronic obstructive pulmonary disease, unspecified: Secondary | ICD-10-CM | POA: Diagnosis not present

## 2020-06-27 DIAGNOSIS — Z6830 Body mass index (BMI) 30.0-30.9, adult: Secondary | ICD-10-CM | POA: Diagnosis not present

## 2020-09-19 DIAGNOSIS — Z23 Encounter for immunization: Secondary | ICD-10-CM | POA: Diagnosis not present

## 2020-10-03 DIAGNOSIS — Z23 Encounter for immunization: Secondary | ICD-10-CM | POA: Diagnosis not present

## 2021-06-17 ENCOUNTER — Other Ambulatory Visit (HOSPITAL_COMMUNITY): Payer: Self-pay | Admitting: Internal Medicine

## 2021-06-17 DIAGNOSIS — Z1231 Encounter for screening mammogram for malignant neoplasm of breast: Secondary | ICD-10-CM

## 2021-06-17 DIAGNOSIS — E2839 Other primary ovarian failure: Secondary | ICD-10-CM

## 2021-07-01 ENCOUNTER — Other Ambulatory Visit (HOSPITAL_COMMUNITY): Payer: PPO

## 2021-07-01 ENCOUNTER — Other Ambulatory Visit: Payer: Self-pay

## 2021-07-01 ENCOUNTER — Ambulatory Visit (HOSPITAL_COMMUNITY): Payer: PPO

## 2021-07-01 ENCOUNTER — Ambulatory Visit (HOSPITAL_COMMUNITY)
Admission: RE | Admit: 2021-07-01 | Discharge: 2021-07-01 | Disposition: A | Discharge status: Home / Self Care | Payer: PPO | Source: Ambulatory Visit | Attending: Internal Medicine | Admitting: Internal Medicine

## 2021-07-01 DIAGNOSIS — Z13820 Encounter for screening for osteoporosis: Secondary | ICD-10-CM | POA: Diagnosis not present

## 2021-07-01 DIAGNOSIS — M810 Age-related osteoporosis without current pathological fracture: Secondary | ICD-10-CM | POA: Diagnosis not present

## 2021-07-01 DIAGNOSIS — Z1231 Encounter for screening mammogram for malignant neoplasm of breast: Secondary | ICD-10-CM | POA: Insufficient documentation

## 2021-07-01 DIAGNOSIS — E2839 Other primary ovarian failure: Secondary | ICD-10-CM | POA: Insufficient documentation

## 2021-07-01 DIAGNOSIS — Z780 Asymptomatic menopausal state: Secondary | ICD-10-CM | POA: Insufficient documentation

## 2021-07-01 DIAGNOSIS — M8588 Other specified disorders of bone density and structure, other site: Secondary | ICD-10-CM | POA: Diagnosis not present

## 2021-07-16 DIAGNOSIS — E063 Autoimmune thyroiditis: Secondary | ICD-10-CM | POA: Diagnosis not present

## 2021-07-16 DIAGNOSIS — I10 Essential (primary) hypertension: Secondary | ICD-10-CM | POA: Diagnosis not present

## 2021-07-16 DIAGNOSIS — U071 COVID-19: Secondary | ICD-10-CM | POA: Diagnosis not present

## 2021-08-11 DIAGNOSIS — E063 Autoimmune thyroiditis: Secondary | ICD-10-CM | POA: Diagnosis not present

## 2021-08-11 DIAGNOSIS — Z1331 Encounter for screening for depression: Secondary | ICD-10-CM | POA: Diagnosis not present

## 2021-08-11 DIAGNOSIS — Z6831 Body mass index (BMI) 31.0-31.9, adult: Secondary | ICD-10-CM | POA: Diagnosis not present

## 2021-08-11 DIAGNOSIS — M1991 Primary osteoarthritis, unspecified site: Secondary | ICD-10-CM | POA: Diagnosis not present

## 2021-08-11 DIAGNOSIS — Z0000 Encounter for general adult medical examination without abnormal findings: Secondary | ICD-10-CM | POA: Diagnosis not present

## 2021-08-11 DIAGNOSIS — M810 Age-related osteoporosis without current pathological fracture: Secondary | ICD-10-CM | POA: Diagnosis not present

## 2021-08-11 DIAGNOSIS — E6609 Other obesity due to excess calories: Secondary | ICD-10-CM | POA: Diagnosis not present

## 2021-08-11 DIAGNOSIS — Z1389 Encounter for screening for other disorder: Secondary | ICD-10-CM | POA: Diagnosis not present

## 2021-09-23 DIAGNOSIS — Z23 Encounter for immunization: Secondary | ICD-10-CM | POA: Diagnosis not present

## 2021-10-30 DIAGNOSIS — H8309 Labyrinthitis, unspecified ear: Secondary | ICD-10-CM | POA: Diagnosis not present

## 2021-10-30 DIAGNOSIS — J329 Chronic sinusitis, unspecified: Secondary | ICD-10-CM | POA: Diagnosis not present

## 2021-10-30 DIAGNOSIS — I10 Essential (primary) hypertension: Secondary | ICD-10-CM | POA: Diagnosis not present

## 2021-10-30 DIAGNOSIS — J441 Chronic obstructive pulmonary disease with (acute) exacerbation: Secondary | ICD-10-CM | POA: Diagnosis not present

## 2021-10-30 DIAGNOSIS — Z6830 Body mass index (BMI) 30.0-30.9, adult: Secondary | ICD-10-CM | POA: Diagnosis not present

## 2021-10-30 DIAGNOSIS — E6609 Other obesity due to excess calories: Secondary | ICD-10-CM | POA: Diagnosis not present

## 2022-02-04 DIAGNOSIS — Z20822 Contact with and (suspected) exposure to covid-19: Secondary | ICD-10-CM | POA: Diagnosis not present

## 2022-02-10 ENCOUNTER — Emergency Department (HOSPITAL_COMMUNITY): Payer: PPO

## 2022-02-10 ENCOUNTER — Other Ambulatory Visit: Payer: Self-pay

## 2022-02-10 ENCOUNTER — Inpatient Hospital Stay (HOSPITAL_COMMUNITY)
Admission: EM | Admit: 2022-02-10 | Discharge: 2022-02-12 | DRG: 440 | Disposition: A | Discharge status: Home / Self Care | Payer: PPO | Attending: Internal Medicine | Admitting: Internal Medicine

## 2022-02-10 ENCOUNTER — Encounter (HOSPITAL_COMMUNITY): Payer: Self-pay

## 2022-02-10 DIAGNOSIS — K2980 Duodenitis without bleeding: Secondary | ICD-10-CM | POA: Diagnosis not present

## 2022-02-10 DIAGNOSIS — R112 Nausea with vomiting, unspecified: Secondary | ICD-10-CM

## 2022-02-10 DIAGNOSIS — F17210 Nicotine dependence, cigarettes, uncomplicated: Secondary | ICD-10-CM | POA: Diagnosis not present

## 2022-02-10 DIAGNOSIS — Z20822 Contact with and (suspected) exposure to covid-19: Secondary | ICD-10-CM | POA: Diagnosis present

## 2022-02-10 DIAGNOSIS — R600 Localized edema: Secondary | ICD-10-CM | POA: Diagnosis not present

## 2022-02-10 DIAGNOSIS — R9431 Abnormal electrocardiogram [ECG] [EKG]: Secondary | ICD-10-CM | POA: Diagnosis present

## 2022-02-10 DIAGNOSIS — Z90710 Acquired absence of both cervix and uterus: Secondary | ICD-10-CM

## 2022-02-10 DIAGNOSIS — K859 Acute pancreatitis without necrosis or infection, unspecified: Secondary | ICD-10-CM | POA: Diagnosis not present

## 2022-02-10 DIAGNOSIS — Z8249 Family history of ischemic heart disease and other diseases of the circulatory system: Secondary | ICD-10-CM | POA: Diagnosis not present

## 2022-02-10 DIAGNOSIS — R748 Abnormal levels of other serum enzymes: Secondary | ICD-10-CM | POA: Diagnosis not present

## 2022-02-10 DIAGNOSIS — I10 Essential (primary) hypertension: Secondary | ICD-10-CM | POA: Diagnosis not present

## 2022-02-10 DIAGNOSIS — J439 Emphysema, unspecified: Secondary | ICD-10-CM | POA: Diagnosis not present

## 2022-02-10 DIAGNOSIS — E876 Hypokalemia: Secondary | ICD-10-CM | POA: Diagnosis not present

## 2022-02-10 DIAGNOSIS — K861 Other chronic pancreatitis: Secondary | ICD-10-CM | POA: Diagnosis present

## 2022-02-10 DIAGNOSIS — Z8041 Family history of malignant neoplasm of ovary: Secondary | ICD-10-CM | POA: Diagnosis not present

## 2022-02-10 DIAGNOSIS — Z833 Family history of diabetes mellitus: Secondary | ICD-10-CM | POA: Diagnosis not present

## 2022-02-10 DIAGNOSIS — Z800 Family history of malignant neoplasm of digestive organs: Secondary | ICD-10-CM

## 2022-02-10 DIAGNOSIS — Z79899 Other long term (current) drug therapy: Secondary | ICD-10-CM

## 2022-02-10 DIAGNOSIS — Z720 Tobacco use: Secondary | ICD-10-CM

## 2022-02-10 DIAGNOSIS — E039 Hypothyroidism, unspecified: Secondary | ICD-10-CM | POA: Diagnosis not present

## 2022-02-10 DIAGNOSIS — Z825 Family history of asthma and other chronic lower respiratory diseases: Secondary | ICD-10-CM | POA: Diagnosis not present

## 2022-02-10 DIAGNOSIS — R1013 Epigastric pain: Secondary | ICD-10-CM

## 2022-02-10 DIAGNOSIS — D72829 Elevated white blood cell count, unspecified: Secondary | ICD-10-CM | POA: Diagnosis not present

## 2022-02-10 DIAGNOSIS — K760 Fatty (change of) liver, not elsewhere classified: Secondary | ICD-10-CM | POA: Diagnosis not present

## 2022-02-10 DIAGNOSIS — I2510 Atherosclerotic heart disease of native coronary artery without angina pectoris: Secondary | ICD-10-CM | POA: Diagnosis not present

## 2022-02-10 DIAGNOSIS — J449 Chronic obstructive pulmonary disease, unspecified: Secondary | ICD-10-CM | POA: Diagnosis present

## 2022-02-10 DIAGNOSIS — K6389 Other specified diseases of intestine: Secondary | ICD-10-CM | POA: Diagnosis not present

## 2022-02-10 DIAGNOSIS — I700 Atherosclerosis of aorta: Secondary | ICD-10-CM | POA: Diagnosis not present

## 2022-02-10 DIAGNOSIS — K3189 Other diseases of stomach and duodenum: Secondary | ICD-10-CM | POA: Diagnosis not present

## 2022-02-10 DIAGNOSIS — R109 Unspecified abdominal pain: Secondary | ICD-10-CM | POA: Diagnosis present

## 2022-02-10 DIAGNOSIS — K8689 Other specified diseases of pancreas: Secondary | ICD-10-CM | POA: Diagnosis not present

## 2022-02-10 LAB — COMPREHENSIVE METABOLIC PANEL
ALT: 14 U/L (ref 0–44)
AST: 17 U/L (ref 15–41)
Albumin: 4.2 g/dL (ref 3.5–5.0)
Alkaline Phosphatase: 78 U/L (ref 38–126)
Anion gap: 8 (ref 5–15)
BUN: 14 mg/dL (ref 8–23)
CO2: 24 mmol/L (ref 22–32)
Calcium: 9.2 mg/dL (ref 8.9–10.3)
Chloride: 105 mmol/L (ref 98–111)
Creatinine, Ser: 0.93 mg/dL (ref 0.44–1.00)
GFR, Estimated: >60 mL/min (ref >60)
Glucose, Bld: 104 mg/dL — ABNORMAL HIGH (ref 70–99)
Potassium: 3.1 mmol/L — ABNORMAL LOW (ref 3.5–5.1)
Sodium: 137 mmol/L (ref 135–145)
Total Bilirubin: 0.3 mg/dL (ref 0.3–1.2)
Total Protein: 8.2 g/dL — ABNORMAL HIGH (ref 6.5–8.1)

## 2022-02-10 LAB — CBC WITH DIFFERENTIAL/PLATELET
Abs Immature Granulocytes: 0.04 10*3/uL (ref 0.00–0.07)
Basophils Absolute: 0.1 10*3/uL (ref 0.0–0.1)
Basophils Relative: 1 %
Eosinophils Absolute: 0.3 10*3/uL (ref 0.0–0.5)
Eosinophils Relative: 3 %
HCT: 43 % (ref 36.0–46.0)
Hemoglobin: 14.1 g/dL (ref 12.0–15.0)
Immature Granulocytes: 0 %
Lymphocytes Relative: 24 %
Lymphs Abs: 3 10*3/uL (ref 0.7–4.0)
MCH: 31.1 pg (ref 26.0–34.0)
MCHC: 32.8 g/dL (ref 30.0–36.0)
MCV: 94.7 fL (ref 80.0–100.0)
Monocytes Absolute: 0.5 10*3/uL (ref 0.1–1.0)
Monocytes Relative: 4 %
Neutro Abs: 8.5 10*3/uL — ABNORMAL HIGH (ref 1.7–7.7)
Neutrophils Relative %: 68 %
Platelets: 363 10*3/uL (ref 150–400)
RBC: 4.54 MIL/uL (ref 3.87–5.11)
RDW: 14.6 % (ref 11.5–15.5)
WBC: 12.4 10*3/uL — ABNORMAL HIGH (ref 4.0–10.5)
nRBC: 0 % (ref 0.0–0.2)

## 2022-02-10 LAB — LACTIC ACID, PLASMA: Lactic Acid, Venous: 0.8 mmol/L (ref 0.5–1.9)

## 2022-02-10 LAB — LIPASE, BLOOD: Lipase: 644 U/L — ABNORMAL HIGH (ref 11–51)

## 2022-02-10 LAB — URINALYSIS, ROUTINE W REFLEX MICROSCOPIC
Bilirubin Urine: NEGATIVE
Glucose, UA: NEGATIVE mg/dL
Hgb urine dipstick: NEGATIVE
Ketones, ur: NEGATIVE mg/dL
Leukocytes,Ua: NEGATIVE
Nitrite: NEGATIVE
Protein, ur: NEGATIVE mg/dL
Specific Gravity, Urine: 1.013 (ref 1.005–1.030)
pH: 7 (ref 5.0–8.0)

## 2022-02-10 LAB — RESP PANEL BY RT-PCR (FLU A&B, COVID) ARPGX2
Influenza A by PCR: NEGATIVE
Influenza B by PCR: NEGATIVE
SARS Coronavirus 2 by RT PCR: NEGATIVE

## 2022-02-10 LAB — MAGNESIUM: Magnesium: 2 mg/dL (ref 1.7–2.4)

## 2022-02-10 LAB — TROPONIN I (HIGH SENSITIVITY)
Troponin I (High Sensitivity): 19 ng/L — ABNORMAL HIGH (ref <18)
Troponin I (High Sensitivity): 17 ng/L (ref <18)

## 2022-02-10 MED ORDER — POTASSIUM CHLORIDE CRYS ER 20 MEQ PO TBCR
40.0000 meq | EXTENDED_RELEASE_TABLET | Freq: Once | ORAL | Status: AC
Start: 2022-02-10 — End: 2022-02-10
  Administered 2022-02-10: 40 meq via ORAL
  Filled 2022-02-10: qty 2

## 2022-02-10 MED ORDER — HYDROMORPHONE HCL 2 MG PO TABS
2.0000 mg | ORAL_TABLET | Freq: Once | ORAL | Status: AC
  Administered 2022-02-10: 2 mg via ORAL
  Filled 2022-02-10: qty 1

## 2022-02-10 MED ORDER — HYDROMORPHONE HCL 1 MG/ML IJ SOLN: 1.0000 mg | INTRAMUSCULAR | Status: DC | PRN

## 2022-02-10 MED ORDER — LACTATED RINGERS IV SOLN: INTRAVENOUS | Status: DC

## 2022-02-10 MED ORDER — POTASSIUM CHLORIDE 10 MEQ/100ML IV SOLN
10.0000 meq | INTRAVENOUS | Status: AC
  Administered 2022-02-10 – 2022-02-11 (×2): 10 meq via INTRAVENOUS
  Filled 2022-02-10 (×2): qty 100

## 2022-02-10 MED ORDER — HYDROMORPHONE HCL 1 MG/ML IJ SOLN
0.5000 mg | Freq: Once | INTRAMUSCULAR | Status: AC
  Administered 2022-02-10: 0.5 mg via INTRAVENOUS
  Filled 2022-02-10: qty 1

## 2022-02-10 MED ORDER — ENOXAPARIN SODIUM 40 MG/0.4ML IJ SOSY
40.0000 mg | PREFILLED_SYRINGE | INTRAMUSCULAR | Status: DC
  Administered 2022-02-11 – 2022-02-12 (×2): 40 mg via SUBCUTANEOUS
  Filled 2022-02-10 (×2): qty 0.4

## 2022-02-10 MED ORDER — HYDROMORPHONE HCL 1 MG/ML IJ SOLN
1.0000 mg | Freq: Once | INTRAMUSCULAR | Status: AC
  Administered 2022-02-10: 1 mg via INTRAVENOUS
  Filled 2022-02-10: qty 1

## 2022-02-10 MED ORDER — HYDROMORPHONE HCL 1 MG/ML IJ SOLN
0.5000 mg | INTRAMUSCULAR | Status: DC | PRN
  Administered 2022-02-11 (×2): 0.5 mg via INTRAVENOUS
  Filled 2022-02-10 (×2): qty 0.5

## 2022-02-10 MED ORDER — IOHEXOL 350 MG/ML SOLN
100.0000 mL | Freq: Once | INTRAVENOUS | Status: AC | PRN
  Administered 2022-02-10: 100 mL via INTRAVENOUS

## 2022-02-10 MED ORDER — PROCHLORPERAZINE EDISYLATE 10 MG/2ML IJ SOLN
5.0000 mg | Freq: Four times a day (QID) | INTRAMUSCULAR | Status: DC | PRN
  Administered 2022-02-10: 5 mg via INTRAVENOUS
  Filled 2022-02-10: qty 2

## 2022-02-10 MED ORDER — PANTOPRAZOLE SODIUM 40 MG IV SOLR
40.0000 mg | INTRAVENOUS | Status: DC
  Administered 2022-02-10 – 2022-02-11 (×2): 40 mg via INTRAVENOUS
  Filled 2022-02-10 (×2): qty 10

## 2022-02-10 MED ORDER — LACTATED RINGERS IV BOLUS
1000.0000 mL | Freq: Once | INTRAVENOUS | Status: AC
  Administered 2022-02-10: 1000 mL via INTRAVENOUS

## 2022-02-10 NOTE — Assessment & Plan Note (Addendum)
Repleted Mag 1.9

## 2022-02-10 NOTE — Assessment & Plan Note (Addendum)
Continue IV Dilaudid 0.5 mg every 3 hours p.r.n for pain Continue Protonix Continue IV LR at 170ml/Hr Continue Clear liquid diet>>advanced to soft diet which she tolerated RUQ U/S--unremarkable CTA abd as discussed above GI consult appreciated -MRI with MRCP in 6-7 weeks and follow up at [facility] with Dr. Carlis Abbott - If MRI suggest mass lesion in addition to large PD stone, EUS may be helpful. -triglycerides 85

## 2022-02-10 NOTE — Assessment & Plan Note (Addendum)
WBC 12.4, this is possibly a reactive process Due to stress demargination Improved at time of d/c--8.8

## 2022-02-10 NOTE — Assessment & Plan Note (Addendum)
QTc 576ms Optimize electrolytes

## 2022-02-10 NOTE — ED Provider Notes (Signed)
[facility] EMERGENCY DEPARTMENT Provider Note   CSN: [id number] Arrival date & time: 02/10/22  1434     History  Chief Complaint  Patient presents with   Abdominal Pain    Elizabeth [name] is a 66 y.o. female.   Abdominal Pain Patient presenting for epigastric pain radiating to her back.  Onset was yesterday.  Per chart review, medical history includes HTN, history of pancreatitis, hypothyroidism, COPD.  Patient reports that she did feel some mild epigastric discomfort yesterday.  This seems to worsen with meals.  She has been able to eat today.  She denies any episodes of nausea or vomiting.  She has had normal stools without evidence of melena or blood.  Pain worsened throughout the day today which prompted her to come to the ED.  She does have a history of pancreatitis and states that this pain is different from her prior episode of pancreatitis.  Previously, she had RUQ pain from her pancreatitis.  Currently, it is midline.  It radiates to the midline of her upper back.  She denies any shortness of breath.  Current pain is 7/10 in severity.  She has not taken anything at home for analgesia.    Home Medications Prior to Admission medications   Medication Sig Start Date End Date Taking? Authorizing Provider  albuterol (VENTOLIN HFA) 108 (90 Base) MCG/ACT inhaler Inhale 2 puffs into the lungs every 6 (six) hours as needed for shortness of breath or wheezing. 10/30/21  Yes [provider]  Cholecalciferol (VITAMIN D3 PO) Take 1 tablet by mouth daily.   Yes [provider]  guaiFENesin (MUCINEX) 600 MG 12 hr tablet Take 600 mg by mouth 2 (two) times daily.   Yes [provider]  albuterol (PROVENTIL) (2.5 MG/3ML) 0.083% nebulizer solution Take 3 mLs (2.5 mg total) by nebulization every 6 (six) hours as needed for wheezing or shortness of breath. Patient not taking: Reported on 02/10/2022 10/19/17   Kathie Dike, MD  guaiFENesin-dextromethorphan [facility] DM)  100-10 MG/5ML syrup Take 5 mLs by mouth every 6 (six) hours. Patient not taking: Reported on 02/10/2022 10/19/17   Kathie Dike, MD  oxyCODONE-acetaminophen (PERCOCET/ROXICET) 5-325 MG tablet Take 1 tablet by mouth every 8 (eight) hours as needed for severe pain. Patient not taking: Reported on 02/10/2022 10/19/17   Kathie Dike, MD      Allergies    Codeine    Review of Systems   Review of Systems  Gastrointestinal:  Positive for abdominal pain.  Musculoskeletal:  Positive for back pain.  All other systems reviewed and are negative.  Physical Exam Updated Vital Signs BP (!) 158/88 (BP Location: Left Arm)    Pulse 61    Temp 97.9 F (36.6 C)    Resp 18    Ht 4\' 11"  (1.499 m)    Wt 64.4 kg    SpO2 97%    BMI 28.68 kg/m  Physical Exam Vitals and nursing note reviewed.  Constitutional:      General: She is not in acute distress.    Appearance: She is well-developed and normal weight. She is not ill-appearing, toxic-appearing or diaphoretic.  HENT:     Head: Normocephalic and atraumatic.     Mouth/Throat:     Mouth: Mucous membranes are moist.     Pharynx: Oropharynx is clear.  Eyes:     Conjunctiva/sclera: Conjunctivae normal.  Cardiovascular:     Rate and Rhythm: Normal rate and regular rhythm.     Heart sounds: No  murmur heard. Pulmonary:     Effort: Pulmonary effort is normal. No respiratory distress.     Breath sounds: Normal breath sounds.  Abdominal:     Palpations: Abdomen is soft.     Tenderness: There is abdominal tenderness (Mild) in the epigastric area. There is no guarding or rebound.  Musculoskeletal:        General: No swelling.     Cervical back: Neck supple.  Skin:    General: Skin is warm and dry.     Capillary Refill: Capillary refill takes less than 2 seconds.     Coloration: Skin is not jaundiced or pale.  Neurological:     General: No focal deficit present.     Mental Status: She is alert and oriented to person, place, and time.     Cranial  Nerves: No cranial nerve deficit.     Motor: No weakness.  Psychiatric:        Mood and Affect: Mood normal.        Behavior: Behavior normal.    ED Results / Procedures / Treatments   Labs (all labs ordered are listed, but only abnormal results are displayed) Labs Reviewed  COMPREHENSIVE METABOLIC PANEL - Abnormal; Notable for the following components:      Result Value   Potassium 3.1 (*)    Glucose, Bld 104 (*)    Total Protein 8.2 (*)    All other components within normal limits  LIPASE, BLOOD - Abnormal; Notable for the following components:   Lipase 644 (*)    All other components within normal limits  CBC WITH DIFFERENTIAL/PLATELET - Abnormal; Notable for the following components:   WBC 12.4 (*)    Neutro Abs 8.5 (*)    All other components within normal limits  TROPONIN I (HIGH SENSITIVITY) - Abnormal; Notable for the following components:   Troponin I (High Sensitivity) 19 (*)    All other components within normal limits  RESP PANEL BY RT-PCR (FLU A&B, COVID) ARPGX2  URINALYSIS, ROUTINE W REFLEX MICROSCOPIC  MAGNESIUM  LACTIC ACID, PLASMA  HIV ANTIBODY (ROUTINE TESTING W REFLEX)  COMPREHENSIVE METABOLIC PANEL  CBC  APTT  MAGNESIUM  PHOSPHORUS  TROPONIN I (HIGH SENSITIVITY)    EKG EKG Interpretation  Date/Time:  Wednesday February 10 2022 15:13:45 EST Ventricular Rate:  86 PR Interval:  164 QRS Duration: 102 QT Interval:  439 QTC Calculation: 526 R Axis:   71 Text Interpretation: Sinus rhythm Consider left atrial enlargement Low voltage, precordial leads Prolonged QT interval Confirmed by Godfrey Pick ([id number]) on 02/10/2022 4:10:08 PM  Radiology CT Angio Chest/Abd/Pel for Dissection W and/or Wo Contrast  Result Date: 02/10/2022 CLINICAL DATA:  Upper abdominal pain sharp pain and burning EXAM: CT ANGIOGRAPHY CHEST, ABDOMEN AND PELVIS TECHNIQUE: Non-contrast CT of the chest was initially obtained. Multidetector CT imaging through the chest, abdomen and pelvis  was performed using the standard protocol during bolus administration of intravenous contrast. Multiplanar reconstructed images and MIPs were obtained and reviewed to evaluate the vascular anatomy. RADIATION DOSE REDUCTION: This exam was performed according to the departmental dose-optimization program which includes automated exposure control, adjustment of the mA and/or kV according to patient size and/or use of iterative reconstruction technique. CONTRAST:  123mL OMNIPAQUE IOHEXOL 350 MG/ML SOLN COMPARISON:  CT 10/17/2017, 06/08/2013 FINDINGS: CTA CHEST FINDINGS Cardiovascular: Non contrasted images of the chest demonstrate no acute intramural hematoma. Aorta is nonaneurysmal. No dissection is seen. Moderate atherosclerosis. Left-sided arch with normal three-vessel origin. Normal cardiac size. No  pericardial effusion Mediastinum/Nodes: Midline trachea. No thyroid mass. No suspicious lymph nodes. Esophagus within normal limits. Lungs/Pleura: Lungs are clear. No pleural effusion or pneumothorax. Musculoskeletal: Sternum is intact. No acute osseous abnormality is seen. Review of the MIP images confirms the above findings. CTA ABDOMEN AND PELVIS FINDINGS VASCULAR Aorta: Negative for dissection, aneurysm or occlusive disease. Moderate atherosclerosis. Celiac: Mild calcification at the origin without significant stenosis. Negative for aneurysm or occlusion. SMA: Mild origin calcification. Negative for aneurysm, dissection or stenosis. Renals: Single right and single left renal arteries are patent and without evidence for stenosis, aneurysm, or occlusion. IMA: Origin calcification. Distal vessel is patent and without stenosis. Inflow: Heavily calcified aortoiliac bifurcation. Moderate severe atherosclerosis of the common iliac vessels. No stenosis or occlusion. Negative for aneurysm. Veins: No obvious venous abnormality within the limitations of this arterial phase study. Review of the MIP images confirms the above  findings. NON-VASCULAR Hepatobiliary: No calcified gallstone. No focal hepatic abnormality. No biliary dilatation Pancreas: Atrophy of the body and tail of pancreas with ductal dilatation, progressed compared to prior. Numerous pancreatic calcifications likely due to chronic pancreatitis. Chunky calcification at the head of the pancreas measuring 17 mm, slightly increased in size, appears to be in the region of pancreatic duct. Indistinct masslike enlargement head of pancreas with mild stranding. Edema within the pancreaticoduodenal groove. Spleen: Normal in size without focal abnormality. Adrenals/Urinary Tract: Thickening of the adrenal glands without dominant mass. Kidneys show no hydronephrosis. The bladder is normal Stomach/Bowel: Stomach nonenlarged. Mild wall thickening and edema at the second portion of duodenum. No extraluminal gas. Lymphatic: Mild porta hepatis lymph nodes. Reproductive: Status post hysterectomy. No adnexal masses. Other: Negative for pelvic effusion or free air. Musculoskeletal: No acute osseous abnormality Review of the MIP images confirms the above findings. IMPRESSION: 1. Negative for acute aortic dissection or aneurysm. Moderate atherosclerosis of the aorta. No hemodynamically significant stenosis within the vasculature of the abdomen or pelvis. 2. Masslike enlargement of the pancreatic head with mild stranding and edema. There is atrophy of the pancreatic body and tail with ductal dilatation. A large calcification at the head of the pancreas in the region of the duct may be slightly increased in size. Uncertain if findings are due to acute on chronic pancreatitis versus inflammatory pancreatic head mass. When the patient is clinically stable and able to follow directions and hold their breath (preferably as an outpatient) further evaluation with dedicated abdominal MRI should be considered. 3. Wall thickening and inflammation at the second portion of duodenum suggesting duodenitis  versus reactive inflammation from adjacent pancreatic inflammation. Electronically Signed   By: Donavan Foil M.D.   On: 02/10/2022 17:06    Procedures Procedures    Medications Ordered in ED Medications  lactated ringers infusion ( Intravenous New Bag/Given 02/10/22 1955)  enoxaparin (LOVENOX) injection 40 mg (has no administration in time range)  pantoprazole (PROTONIX) injection 40 mg (40 mg Intravenous Given 02/10/22 2224)  prochlorperazine (COMPAZINE) injection 5 mg (5 mg Intravenous Given 02/10/22 2137)  HYDROmorphone (DILAUDID) injection 0.5 mg (has no administration in time range)  lactated ringers bolus 1,000 mL (1,000 mLs Intravenous Bolus 02/10/22 1534)  HYDROmorphone (DILAUDID) injection 0.5 mg (0.5 mg Intravenous Given 02/10/22 1534)  iohexol (OMNIPAQUE) 350 MG/ML injection 100 mL (100 mLs Intravenous Contrast Given 02/10/22 1630)  HYDROmorphone (DILAUDID) tablet 2 mg (2 mg Oral Given 02/10/22 1844)  HYDROmorphone (DILAUDID) injection 1 mg (1 mg Intravenous Given 02/10/22 1955)  potassium chloride SA (KLOR-CON M) CR tablet 40 mEq (40  mEq Oral Given 02/10/22 2224)  potassium chloride 10 mEq in 100 mL IVPB (10 mEq Intravenous New Bag/Given 02/11/22 0018)    ED Course/ Medical Decision Making/ A&P                           Medical Decision Making Amount and/or Complexity of Data Reviewed Labs: ordered. Radiology: ordered.  Risk Prescription drug management. Decision regarding hospitalization.   This patient presents to the ED for concern of epigastric pain, this involves an extensive number of treatment options, and is a complaint that carries with it a high risk of complications and morbidity.  The differential diagnosis includes gastritis, PUD, pancreatitis, aortic dissection, ACS   Co morbidities that complicate the patient evaluation  HTN, history of pancreatitis, hypothyroidism, COPD   Additional history obtained:  Additional history obtained from N/A External  records from outside source obtained and reviewed including EMR   Lab Tests:  I Ordered, and personally interpreted labs.  The pertinent results include: Elevated lipase, hypokalemia, leukocytosis   Imaging Studies ordered:  I ordered imaging studies including CT aortogram I independently visualized and interpreted imaging which showed evidence of pancreatitis and duodenitis I agree with the radiologist interpretation   Cardiac Monitoring:  The patient was maintained on a cardiac monitor.  I personally viewed and interpreted the cardiac monitored which showed an underlying rhythm of: Sinus rhythm   Medicines ordered and prescription drug management:  I ordered medication including Dilaudid for analgesia, potassium chloride for hypokalemia, IVF for hydration Reevaluation of the patient after these medicines showed that the patient improved I have reviewed the patients home medicines and have made adjustments as needed  Problem List / ED Course:  66 year old female presenting for epigastric pain, radiating to her back, since yesterday.  Pain is worsened with eating.  She denies any associated shortness of breath.  Patient was given IV fluids and Dilaudid.  Laboratory work-up was initiated.  Lipase was elevated, consistent with pancreatitis.  CT aortogram also showed peripancreatic inflammatory change as well as adjacent duodenitis.  Patient states that she had gallstone pancreatitis in the past.  This is approximately 8 years ago.  On today's imaging, there is concern for pancreatic head mass like enlargement.  Patient was informed of this.  Patient underwent several additional rounds of Dilaudid for analgesia while in the ED.  Patient's pain was unable to be fully controlled.  She was admitted for bowel rest and pain control.   Reevaluation:  After the interventions noted above, I reevaluated the patient and found that they have :stayed the same   Social Determinants of  Health:  Lives independently   Dispostion:  After consideration of the diagnostic results and the patients response to treatment, I feel that the patent would benefit from admission.          Final Clinical Impression(s) / ED Diagnoses Final diagnoses:  Acute pancreatitis, unspecified complication status, unspecified pancreatitis type    Rx / DC Orders ED Discharge Orders     None         Godfrey Pick, MD 02/11/22 0126

## 2022-02-10 NOTE — Assessment & Plan Note (Addendum)
Stable on RA Continue albuterol per home regimen

## 2022-02-10 NOTE — Assessment & Plan Note (Deleted)
Continue IV Compazine as needed

## 2022-02-10 NOTE — H&P (Signed)
History and Physical    Patient: Elizabeth [name] XTG:[id number] DOB: 1956-11-08 DOA: 02/10/2022 DOS: the patient was seen and examined on 02/10/2022 PCP: Redmond School, MD  Patient coming from: Home  Chief Complaint:  Chief Complaint  Patient presents with   Abdominal Pain    HPI: Elizabeth [name] is a 66 y.o. female with medical history significant of pancreatitis, hypothyroidism, hypertension, COPD who presents to the emergency department due to 1 day onset of abdominal pain.  Patient complained of epigastric pain with radiation to the back which started yesterday, patient states that she thought it was due to hunger, however, the pain worsened at night and was rated as 10/10 on pain scale, she complained of burning sensation which worsens with meals, there was no known alleviating factors.  Pain continued today, so she decided to go to the ED for further evaluation and management.  She denies nausea, vomiting, fever, chills, chest pain, shortness of breath and denied history of cholecystectomy.  ED course: In the emergency department, BP was elevated at 173/84, other vital signs were within normal range.  Work-up in the ED showed normal CBC except for leukocytosis at 12.4, and normal BMP except for hypokalemia and blood glucose at 104.  Magnesium 2.0, troponin x2 - 19 > 17, lipase 644, lactic acid 0.8, urinalysis was normal.  Influenza A, B, SARS coronavirus was negative. CT angiographic chest, abdomen and pelvis was negative for acute aortic dissection or aneurysm. Masslike enlargement of the pancreatic head with mild stranding and edema. There is atrophy of the pancreatic body and tail with ductal dilatation. A large calcification at the head of the pancreas in the region of the duct may be slightly increased in size. Uncertain if findings are due to acute on chronic pancreatitis versus inflammatory pancreatic head mass. Patient was treated with IV hydration and IV Dilaudid.  Hospitalist was asked  to admit patient for further evaluation and management. At bedside, patient vomited (nonbloody) x1, but was in no acute distress.  Review of Systems: As mentioned in the history of present illness. All other systems reviewed and are negative. Past Medical History:  Diagnosis Date   Arthritis    Back pain    Hypertension    Hypothyroidism    Pancreatitis due to common bile duct stone 11/2012   Past Surgical History:  Procedure Laterality Date   ABDOMINAL HYSTERECTOMY     APPENDECTOMY     COLONOSCOPY N/A 01/31/2013   Procedure: COLONOSCOPY;  Surgeon: Rogene Houston, MD;  Location: [facility];  Service: Endoscopy;  Laterality: N/A;  100   ERCP     HERNIA REPAIR     Right rotator cuff repair     TUBAL LIGATION     Social History:  reports that she has been smoking cigarettes. She has a 41.00 pack-year smoking history. She has never used smokeless tobacco. She reports current alcohol use. She reports that she does not use drugs.  Allergies  Allergen Reactions   Codeine Itching and Other (See Comments)    Restlessness     Family History  Problem Relation Age of Onset   Ovarian cancer Mother    COPD Father    Diabetes Brother    Liver cancer Brother    Hypertension Sister    Mental illness Sister    Hypertension Son    Heart attack Maternal Grandfather    Other Maternal Grandfather        had a trach   Colon cancer  Neg Hx     Prior to Admission medications   Medication Sig Start Date End Date Taking? Authorizing Provider  albuterol (VENTOLIN HFA) 108 (90 Base) MCG/ACT inhaler Inhale 2 puffs into the lungs every 6 (six) hours as needed for shortness of breath or wheezing. 10/30/21  Yes [provider]  Cholecalciferol (VITAMIN D3 PO) Take 1 tablet by mouth daily.   Yes [provider]  guaiFENesin (MUCINEX) 600 MG 12 hr tablet Take 600 mg by mouth 2 (two) times daily.   Yes [provider]  albuterol (PROVENTIL) (2.5 MG/3ML) 0.083% nebulizer  solution Take 3 mLs (2.5 mg total) by nebulization every 6 (six) hours as needed for wheezing or shortness of breath. Patient not taking: Reported on 02/10/2022 10/19/17   Kathie Dike, MD  guaiFENesin-dextromethorphan [facility] DM) 100-10 MG/5ML syrup Take 5 mLs by mouth every 6 (six) hours. Patient not taking: Reported on 02/10/2022 10/19/17   Kathie Dike, MD  oxyCODONE-acetaminophen (PERCOCET/ROXICET) 5-325 MG tablet Take 1 tablet by mouth every 8 (eight) hours as needed for severe pain. Patient not taking: Reported on 02/10/2022 10/19/17   Kathie Dike, MD    Physical Exam: Vitals:   02/10/22 1830 02/10/22 1900 02/10/22 1930 02/10/22 2030  BP: (!) 182/89 (!) 179/83 (!) 175/84 137/84  Pulse: 72 73 74 67  Resp: 18 (!) 24 20 14   Temp:      TempSrc:      SpO2: 99% 97% 99% 91%  Weight:      Height:       General: Patient was awake and alert and oriented x3. Not in any acute distress.  HEENT: NCAT.  PERRLA. EOMI. Sclerae anicteric.  Moist mucosal membranes. Neck: Neck supple without lymphadenopathy. No carotid bruits. No masses palpated.  Cardiovascular: Regular rate with normal S1-S2 sounds. No murmurs, rubs or gallops auscultated. No JVD.  Respiratory: Clear breath sounds.  No accessory muscle use. Abdomen: Soft, tender to palpation in the epigastric area, nondistended. Active bowel sounds. No masses or hepatosplenomegaly  Skin: No rashes, lesions, or ulcerations.  Dry, warm to touch. Musculoskeletal:  2+ dorsalis pedis and radial pulses. Good ROM.  No contractures  Psychiatric: Intact judgment and insight.  Mood appropriate to current condition. Neurologic: No focal neurological deficits. Strength is 5/5 x 4.  CN II - XII grossly intact.   Data Reviewed: EKG personally reviewed showed normal sinus rhythm at a rate of 86/min with QTc 526 ms  Assessment and Plan: * Acute on chronic pancreatitis ([place])- (present on admission) BISAP Score = 1 point Continue IV Dilaudid 0.5  mg every 3 hours p.r.n for pain Continue Protonix Continue IV LR at 128ml/Hr Continue Clear liquid diet with plan to advance diet as tolerated RUQ U/S in the morning to investigate biliary etiology (gallstone and bile duct dilatation)    Elevated lipase Lipase 644, this is possibly secondary to pancreatitis Continue management as described for acute on chronic pancreatitis  Nausea & vomiting Continue IV Compazine as needed  Prolonged QT interval- (present on admission) QTc 548ms Avoid QT prolonging drugs K+ 3.1, this will be replenished Magnesium level - 2.0 Repeat EKG in the morning   Hypokalemia- (present on admission) K+ is 3.1 K+ will be replenished Please monitor for AM K+ for further replenishmemnt   Abdominal pain- (present on admission) Continue IV Dilaudid as needed Continue IV Protonix 40 mg daily  COPD (chronic obstructive pulmonary disease) ([place]) Patient was not in an acute exacerbation of COPD Continue albuterol per home regimen  Leukocytosis- (present on admission) WBC 12.4, this is possibly a reactive process Continue to monitor WBC with morning labs   Advance Care Planning:   Code Status: Prior   Consults: None  Family Communication: Son at bedside  Severity of Illness: The appropriate patient status for this patient is INPATIENT. Inpatient status is judged to be reasonable and necessary in order to provide the required intensity of service to ensure the patient's safety. The patient's presenting symptoms, physical exam findings, and initial radiographic and laboratory data in the context of their chronic comorbidities is felt to place them at high risk for further clinical deterioration. Furthermore, it is not anticipated that the patient will be medically stable for discharge from the hospital within 2 midnights of admission.   * I certify that at the point of admission it is my clinical judgment that the patient will require inpatient hospital  care spanning beyond 2 midnights from the point of admission due to high intensity of service, high risk for further deterioration and high frequency of surveillance required.*  Author: Bernadette Hoit, DO 02/10/2022 8:54 PM  For on call review [email or web address].

## 2022-02-10 NOTE — Assessment & Plan Note (Deleted)
Lipase 644, this is possibly secondary to pancreatitis Continue management as described for acute on chronic pancreatitis

## 2022-02-10 NOTE — ED Triage Notes (Signed)
Pt presents to ED with complaints top part of abdomen radiating to her back. Pt denies N/V/D. Pt states pain feels burning but has episodes of sharp pains started yesterday

## 2022-02-10 NOTE — Assessment & Plan Note (Deleted)
Continue IV Dilaudid as needed Continue IV Protonix 40 mg daily

## 2022-02-11 ENCOUNTER — Inpatient Hospital Stay (HOSPITAL_COMMUNITY): Payer: PPO

## 2022-02-11 DIAGNOSIS — Z720 Tobacco use: Secondary | ICD-10-CM

## 2022-02-11 LAB — CBC
HCT: 38.1 % (ref 36.0–46.0)
Hemoglobin: 12.4 g/dL (ref 12.0–15.0)
MCH: 30.8 pg (ref 26.0–34.0)
MCHC: 32.5 g/dL (ref 30.0–36.0)
MCV: 94.8 fL (ref 80.0–100.0)
Platelets: 325 10*3/uL (ref 150–400)
RBC: 4.02 MIL/uL (ref 3.87–5.11)
RDW: 14.6 % (ref 11.5–15.5)
WBC: 11.4 10*3/uL — ABNORMAL HIGH (ref 4.0–10.5)
nRBC: 0 % (ref 0.0–0.2)

## 2022-02-11 LAB — PHOSPHORUS: Phosphorus: 2.8 mg/dL (ref 2.5–4.6)

## 2022-02-11 LAB — LIPID PANEL
Cholesterol: 192 mg/dL (ref 0–200)
HDL: 45 mg/dL (ref >40)
LDL Cholesterol: 130 mg/dL — ABNORMAL HIGH (ref 0–99)
Total CHOL/HDL Ratio: 4.3 RATIO
Triglycerides: 85 mg/dL (ref <150)
VLDL: 17 mg/dL (ref 0–40)

## 2022-02-11 LAB — APTT: aPTT: 27 seconds (ref 24–36)

## 2022-02-11 LAB — HIV ANTIBODY (ROUTINE TESTING W REFLEX): HIV Screen 4th Generation wRfx: NONREACTIVE

## 2022-02-11 LAB — COMPREHENSIVE METABOLIC PANEL
ALT: 11 U/L (ref 0–44)
AST: 12 U/L — ABNORMAL LOW (ref 15–41)
Albumin: 3.4 g/dL — ABNORMAL LOW (ref 3.5–5.0)
Alkaline Phosphatase: 69 U/L (ref 38–126)
Anion gap: 5 (ref 5–15)
BUN: 10 mg/dL (ref 8–23)
CO2: 25 mmol/L (ref 22–32)
Calcium: 8.8 mg/dL — ABNORMAL LOW (ref 8.9–10.3)
Chloride: 108 mmol/L (ref 98–111)
Creatinine, Ser: 0.78 mg/dL (ref 0.44–1.00)
GFR, Estimated: >60 mL/min (ref >60)
Glucose, Bld: 121 mg/dL — ABNORMAL HIGH (ref 70–99)
Potassium: 4.6 mmol/L (ref 3.5–5.1)
Sodium: 138 mmol/L (ref 135–145)
Total Bilirubin: 0.6 mg/dL (ref 0.3–1.2)
Total Protein: 6.6 g/dL (ref 6.5–8.1)

## 2022-02-11 LAB — MAGNESIUM: Magnesium: 1.9 mg/dL (ref 1.7–2.4)

## 2022-02-11 NOTE — Plan of Care (Signed)

## 2022-02-11 NOTE — Consult Note (Signed)
Gastroenterology Consult   Referring Provider: No ref. provider found Primary Care Physician:  Redmond School, MD Primary Gastroenterologist:  Dr. Carlis Abbott (General Surgery with [facility])  Patient ID: Elizabeth [name]; [id number]; [birth year]   Admit date: 02/10/2022  LOS: 1 day   Date of Consultation: 02/11/2022  Reason for Consultation:  Acute on chronic pancreatitis with pancreatic duct stone  History of Present Illness   Elizabeth [name] is a 66 y.o. year old female with history of HTN, hypothyroidism, arthritis, history of pancreatitis and pancreatic duct stone who presented to the hospital yesterday with 2 to 3 days of abdominal pain.  She has CT evidence of pancreatic atrophy and ductal dilation, numerous pancreatic calcifications, chunky calcification at the head of the pancreas measuring 17 mm, and indistinct masslike enlargement of the head of the pancreas and mild stranding with edema. Right upper quadrant U/S revealed no CBD dilation and no evidence of cholelithiasis. Lipase 644, and presented with mild leukocytosis (WBC 12.4). GI was consulted to assist with acute on chronic pancreatitis with pancreatic duct stone management.   Per Epic review: She has evidence of pancreatic duct dilation seen on U/S from 2013 as well as CT revealing 13 mm calcification in the pancreatic duct. At this time she was transferred to [facility] at the recommendation of Dr. Laural Golden for EUS/ERCP. Intervention with ERCP was unsuccessful and she began to improve clinically and was discharged home.  She had follow-up outpatient a few weeks later with Dr. Carlis Abbott with [facility].  She then completed an MRCP which revealed a filling defect in the main pancreatic duct in the pancreatic head, corresponding to pancreatic ductal calculus seen on the CT scan.  She had irregular dilation of the pancreatic duct with abrupt cut off at the level of the stone there was evidence of atrophy in  the pancreatic body and tail; these findings were consistent with chronic pancreatitis.  No pancreatic mass found at that time.  She had repeat CT scan in May and November 2014 with little change in calcification and duct dilation.  She had follow-up MRCP in December 2015 with similar findings as before, with the addition of hepatomegaly and evidence of steatosis.  Her last visit with Dr. Carlis Abbott was in December 2015 which she reported she had not had any abdominal pain but continued to have imaging evidence of chronic pancreatitis and surgical intervention and repeat ERCP discussed with her at that time.  Annual follow-up was recommended.  Upon entering the room the patient reported that she began to have increasing pain within the prior hour but had not seen her nurse to ask for pain medication. She reported she had not had any pain medication since the prior night.  Patient reports that until this week she has not had any repeat abdominal pain related to her chronic pancreatitis since her episode in 2013.  She reported that in 2013 her primary abdominal pain was in her right upper quadrant and states that this time it is more in her mid upper abdomen. She described her initial pain as a hunger/gnawing pain that became more severe and has been radiating to her back.  The patient does state that she has known that she has fatty liver. She reports a wide variety of her diet including fried foods some days and vegetables and fruits other days.  Of note she has been a smoker for about 40 years, denies any other drug use, seldomly has a  glass of wine and reports her last drink was over 2 months ago.  She denies any dysphagia, hematemesis, hematochezia, or melena.   Thus far this morning she was able to have some water and some broth without any immediate pain.   Last colonoscopy in 2014 completed by Dr. Laural Golden, due in 2024.  Past Medical History:  Diagnosis Date   Arthritis    Back pain    Hypertension     Hypothyroidism    Pancreatitis due to common bile duct stone 11/2012    Past Surgical History:  Procedure Laterality Date   ABDOMINAL HYSTERECTOMY     APPENDECTOMY     COLONOSCOPY N/A 01/31/2013   Procedure: COLONOSCOPY;  Surgeon: Rogene Houston, MD;  Location: [facility];  Service: Endoscopy;  Laterality: N/A;  100   ERCP     HERNIA REPAIR     Right rotator cuff repair     TUBAL LIGATION      Prior to Admission medications   Medication Sig Start Date End Date Taking? Authorizing Provider  albuterol (VENTOLIN HFA) 108 (90 Base) MCG/ACT inhaler Inhale 2 puffs into the lungs every 6 (six) hours as needed for shortness of breath or wheezing. 10/30/21  Yes [provider]  Cholecalciferol (VITAMIN D3 PO) Take 1 tablet by mouth daily.   Yes [provider]  guaiFENesin (MUCINEX) 600 MG 12 hr tablet Take 600 mg by mouth 2 (two) times daily.   Yes [provider]  albuterol (PROVENTIL) (2.5 MG/3ML) 0.083% nebulizer solution Take 3 mLs (2.5 mg total) by nebulization every 6 (six) hours as needed for wheezing or shortness of breath. Patient not taking: Reported on 02/10/2022 10/19/17   Kathie Dike, MD  guaiFENesin-dextromethorphan [facility] DM) 100-10 MG/5ML syrup Take 5 mLs by mouth every 6 (six) hours. Patient not taking: Reported on 02/10/2022 10/19/17   Kathie Dike, MD  oxyCODONE-acetaminophen (PERCOCET/ROXICET) 5-325 MG tablet Take 1 tablet by mouth every 8 (eight) hours as needed for severe pain. Patient not taking: Reported on 02/10/2022 10/19/17   Kathie Dike, MD    Current Facility-Administered Medications  Medication Dose Route Frequency Provider Last Rate Last Admin   enoxaparin (LOVENOX) injection 40 mg  40 mg Subcutaneous Q24H Adefeso, Oladapo, DO   40 mg at 02/11/22 7867   HYDROmorphone (DILAUDID) injection 0.5 mg  0.5 mg Intravenous Q3H PRN Adefeso, Oladapo, DO   0.5 mg at 02/11/22 1152   lactated ringers infusion   Intravenous  Continuous Adefeso, Oladapo, DO 125 mL/hr at 02/11/22 1542 Infusion Verify at 02/11/22 1542   pantoprazole (PROTONIX) injection 40 mg  40 mg Intravenous Q24H Adefeso, Oladapo, DO   40 mg at 02/10/22 2224   prochlorperazine (COMPAZINE) injection 5 mg  5 mg Intravenous Q6H PRN Adefeso, Oladapo, DO   5 mg at 02/10/22 2137    Allergies as of 02/10/2022 - Review Complete 02/10/2022  Allergen Reaction Noted   Codeine Itching and Other (See Comments) 12/04/2012    Family History  Problem Relation Age of Onset   Ovarian cancer Mother    COPD Father    Diabetes Brother    Liver cancer Brother    Hypertension Sister    Mental illness Sister    Hypertension Son    Heart attack Maternal Grandfather    Other Maternal Grandfather        had a trach   Colon cancer Neg Hx     Social History   Socioeconomic History   Marital status: Divorced  Spouse name: Not on file   Number of children: 2   Years of education: Not on file   Highest education level: Not on file  Occupational History    Employer: [employer]  Tobacco Use   Smoking status: Every Day    Packs/day: 1.00    Years: 41.00    Pack years: 41.00    Types: Cigarettes   Smokeless tobacco: Never  Substance and Sexual Activity   Alcohol use: Yes    Comment: rarely   Drug use: No   Sexual activity: Not Currently    Birth control/protection: Surgical    Comment: hyst  Other Topics Concern   Not on file  Social History Narrative   Not on file   Social Determinants of Health   Financial Resource Strain: Not on file  Food Insecurity: Not on file  Transportation Needs: Not on file  Physical Activity: Not on file  Stress: Not on file  Social Connections: Not on file  Intimate Partner Violence: Not on file     Review of Systems   Gen: Denies any fever, chills, loss of appetite, change in weight or weight loss CV: Denies chest pain, heart palpitations, syncope, edema  Resp: Denies shortness of breath with  rest, cough, wheezing, coughing up blood, and pleurisy. GI: see HPI GU : Denies urinary burning, blood in urine, urinary frequency, and urinary incontinence. MS: Denies joint pain, limitation of movement, swelling, cramps, and atrophy.  Derm: Denies rash, itching, dry skin, hives. Psych: Denies depression, anxiety, memory loss, hallucinations, and confusion. Heme: Denies bruising or bleeding Neuro:  Denies any headaches, dizziness, paresthesias, shaking  Physical Exam   Vital Signs in last 24 hours: Temp:  [97.9 F (36.6 C)-98.5 F (36.9 C)] 98.5 F (36.9 C) (02/23 1339) Pulse Rate:  [61-84] 74 (02/23 1339) Resp:  [14-25] 18 (02/23 1339) BP: (128-192)/(57-98) 129/77 (02/23 1339) SpO2:  [91 %-100 %] 100 % (02/23 1339) Last BM Date : 02/10/22  General:   Alert,  Well-developed, well-nourished, pleasant and cooperative. Appears to be in pain.  Head:  Normocephalic and atraumatic. Eyes:  Sclera clear, no icterus. Conjunctiva pink. Mouth:  No deformity or lesions, dentition normal. Neck:  Supple; no masses Lungs:  Clear throughout to auscultation. No wheezes, crackles, or rhonchi. No acute distress. Heart:  Regular rate and rhythm; no murmurs, clicks, rubs,  or gallops. Abdomen:  Soft, TTP to RUQ and epigastric region. No masses, slight hepatomegaly. No hernias noted. Normal bowel sounds, without guarding, and without rebound.  Rectal: deferred Msk:  Symmetrical without gross deformities. Normal posture. Extremities:  Without clubbing or edema. Neurologic:  Alert and  oriented x4. Skin:  Intact without significant lesions or rashes. Psych:  Alert and cooperative. Normal mood and affect.  Intake/Output from previous day: No intake/output data recorded. Intake/Output this shift: Total I/O In: 3080 [P.O.:1080; I.V.:2000] Out: -    Labs/Studies   Recent Labs Recent Labs    02/10/22 1509 02/11/22 0523  WBC 12.4* 11.4*  HGB 14.1 12.4  HCT 43.0 38.1  PLT 363 325    BMET Recent Labs    02/10/22 1509 02/11/22 0523  NA 137 138  K 3.1* 4.6  CL 105 108  CO2 24 25  GLUCOSE 104* 121*  BUN 14 10  CREATININE 0.93 0.78  CALCIUM 9.2 8.8*   LFT Recent Labs    02/10/22 1509 02/11/22 0523  PROT 8.2* 6.6  ALBUMIN 4.2 3.4*  AST 17 12*  ALT 14 11  ALKPHOS 78 69  BILITOT 0.3 0.6   PT/INR No results for input(s): LABPROT, INR in the last 72 hours. Hepatitis Panel No results for input(s): HEPBSAG, HCVAB, HEPAIGM, HEPBIGM in the last 72 hours. C-Diff No results for input(s): CDIFFTOX in the last 72 hours.  Radiology/Studies CT Angio Chest/Abd/Pel for Dissection W and/or Wo Contrast  Result Date: 02/10/2022 CLINICAL DATA:  Upper abdominal pain sharp pain and burning EXAM: CT ANGIOGRAPHY CHEST, ABDOMEN AND PELVIS TECHNIQUE: Non-contrast CT of the chest was initially obtained. Multidetector CT imaging through the chest, abdomen and pelvis was performed using the standard protocol during bolus administration of intravenous contrast. Multiplanar reconstructed images and MIPs were obtained and reviewed to evaluate the vascular anatomy. RADIATION DOSE REDUCTION: This exam was performed according to the departmental dose-optimization program which includes automated exposure control, adjustment of the mA and/or kV according to patient size and/or use of iterative reconstruction technique. CONTRAST:  181m OMNIPAQUE IOHEXOL 350 MG/ML SOLN COMPARISON:  CT 10/17/2017, 06/08/2013 FINDINGS: CTA CHEST FINDINGS Cardiovascular: Non contrasted images of the chest demonstrate no acute intramural hematoma. Aorta is nonaneurysmal. No dissection is seen. Moderate atherosclerosis. Left-sided arch with normal three-vessel origin. Normal cardiac size. No pericardial effusion Mediastinum/Nodes: Midline trachea. No thyroid mass. No suspicious lymph nodes. Esophagus within normal limits. Lungs/Pleura: Lungs are clear. No pleural effusion or pneumothorax. Musculoskeletal: Sternum is  intact. No acute osseous abnormality is seen. Review of the MIP images confirms the above findings. CTA ABDOMEN AND PELVIS FINDINGS VASCULAR Aorta: Negative for dissection, aneurysm or occlusive disease. Moderate atherosclerosis. Celiac: Mild calcification at the origin without significant stenosis. Negative for aneurysm or occlusion. SMA: Mild origin calcification. Negative for aneurysm, dissection or stenosis. Renals: Single right and single left renal arteries are patent and without evidence for stenosis, aneurysm, or occlusion. IMA: Origin calcification. Distal vessel is patent and without stenosis. Inflow: Heavily calcified aortoiliac bifurcation. Moderate severe atherosclerosis of the common iliac vessels. No stenosis or occlusion. Negative for aneurysm. Veins: No obvious venous abnormality within the limitations of this arterial phase study. Review of the MIP images confirms the above findings. NON-VASCULAR Hepatobiliary: No calcified gallstone. No focal hepatic abnormality. No biliary dilatation Pancreas: Atrophy of the body and tail of pancreas with ductal dilatation, progressed compared to prior. Numerous pancreatic calcifications likely due to chronic pancreatitis. Chunky calcification at the head of the pancreas measuring 17 mm, slightly increased in size, appears to be in the region of pancreatic duct. Indistinct masslike enlargement head of pancreas with mild stranding. Edema within the pancreaticoduodenal groove. Spleen: Normal in size without focal abnormality. Adrenals/Urinary Tract: Thickening of the adrenal glands without dominant mass. Kidneys show no hydronephrosis. The bladder is normal Stomach/Bowel: Stomach nonenlarged. Mild wall thickening and edema at the second portion of duodenum. No extraluminal gas. Lymphatic: Mild porta hepatis lymph nodes. Reproductive: Status post hysterectomy. No adnexal masses. Other: Negative for pelvic effusion or free air. Musculoskeletal: No acute osseous  abnormality Review of the MIP images confirms the above findings. IMPRESSION: 1. Negative for acute aortic dissection or aneurysm. Moderate atherosclerosis of the aorta. No hemodynamically significant stenosis within the vasculature of the abdomen or pelvis. 2. Masslike enlargement of the pancreatic head with mild stranding and edema. There is atrophy of the pancreatic body and tail with ductal dilatation. A large calcification at the head of the pancreas in the region of the duct may be slightly increased in size. Uncertain if findings are due to acute on chronic pancreatitis versus inflammatory pancreatic head mass. When the  patient is clinically stable and able to follow directions and hold their breath (preferably as an outpatient) further evaluation with dedicated abdominal MRI should be considered. 3. Wall thickening and inflammation at the second portion of duodenum suggesting duodenitis versus reactive inflammation from adjacent pancreatic inflammation. Electronically Signed   By: Donavan Foil M.D.   On: 02/10/2022 17:06   US Abdomen Limited RUQ (LIVER/GB)  Result Date: 02/11/2022 CLINICAL DATA:  Acute pancreatitis EXAM: ULTRASOUND ABDOMEN LIMITED RIGHT UPPER QUADRANT COMPARISON:  None. FINDINGS: Gallbladder: No gallstones or wall thickening visualized. No sonographic Murphy sign noted by sonographer. Common bile duct: Diameter: 4 mm, normal Liver: No focal lesion identified. Within normal limits in parenchymal echogenicity. Portal vein is patent on color Doppler imaging with normal direction of blood flow towards the liver. Other: None. IMPRESSION: Unremarkable right upper quadrant ultrasound. Electronically Signed   By: Macy Mis M.D.   On: 02/11/2022 09:11     Assessment   Elizabeth [name] is a 66 y.o. year old female with history of HTN, hypothyroidism, arthritis, history of chronic pancreatitis and pancreatic duct stone with ductal dilation. GI consulted for acute on chronic pancreatitis and  pancreatic duct stone.  Acute on Chronic pancreatitis: LFTs have been normal, alk phos normal, Triglycerides 85, Lipase 644. Leukocytosis improving. RUQ U/S negative for biliary cause of pancreatitis. CT with evidence of pancreatic duct dilation and 17 mm pancreatic stone, atrophy, numerous pancreatic calcifications, and a mass-like enhancement at the head of the pancreas. No history of significant alcohol use. She has failed ERCP in the past but improved immediately post attempt, had no pain during her follow up appointments with little change in her imaging so no consideration of repeat ERCP or surgical intervention at that time. Now that she is presenting with acute pancreatitis and new evidence of mass like enhancement we will continue with supportive care for now and continue clear liquids if pain controlled. Spoke with Dr. Abbey Chatters - Will reach out to Dr. Rush Landmark to determine MRCP vs EUS outpatient.    Plan / Recommendations   Continue clear liquid diet if pain not worsened with eating, will advance diet as tolerated. Continue supportive measures with IV hydration, pain medication, and nausea management.  MRCP vs EUS outpatient with Dr. Rush Landmark pending his evaluation.       02/11/2022, 4:03 PM  Venetia Night, MSN, FNP-BC, AGACNP-BC [facility]

## 2022-02-11 NOTE — Progress Notes (Signed)
PROGRESS NOTE  Elizabeth [name] DZH:[id number] DOB: 09-25-1956 DOA: 02/10/2022 PCP: Redmond School, MD  Brief History:  66 year old female with a history of hypertension, hypothyroidism, COPD, chronic pancreatitis with pancreatic duct stone presenting with 1 day of epigastric abdominal pain radiating to the back.  The patient states that this is similar to her prior episode of pancreatitis back in December 2013.  At that time, the patient was noted to have a dilated pancreatic duct with pancreatic duct stone measuring up to 13 mm.  She was ultimately seen at [facility] for pancreatic duct stone.  After period of follow-up, the patient did not have any exacerbations of her pancreatitis.  Therefore, observation and surveillance was opted as opposed to any surgical intervention.  The patient states that she has not had any episodes of pancreatitis up until this current episode.  She denies any new medications.  She rarely drinks alcohol.  She denies any over-the-counter supplements.  She denies any fever, chills, headache, chest pain, shortness breath, coughing, hemoptysis, diarrhea.  She did have 2 episodes of emesis since admission without blood. In the ED, the patient had low-grade temperature of 99.0 F.  She was hemodynamically stable.  Oxygen saturation was 100% room air.  BMP showed a sodium 137, potassium 4.1, bicarbonate 24, BUN 14, creatinine 0.93.  AST 17, ALT 14, alkaline phosphatase 70, total bilirubin 0.3, lipase 644.  CTA abdomen and pelvis showed chunky calcification in the pancreatic head but up to 17 mm.  There was masslike enlargement of the pancreatic head with surrounding stranding and edema.  There is pancreatic atrophy in the body and tail.  There was wall thickening in the second portion of the duodenum.  The patient was started on intravenous opiates on IV fluids.  GI was consulted to assist with management.    Assessment and Plan: * Acute on chronic pancreatitis ([place])- (present on  admission) Continue IV Dilaudid 0.5 mg every 3 hours p.r.n for pain Continue Protonix Continue IV LR at 12ml/Hr Continue Clear liquid diet  RUQ U/S CTA abd as discussed above GI consult -triglycerides 85  Tobacco abuse Tobacco cessation discussed 40+ pack year hx  Prolonged QT interval- (present on admission) QTc 546ms Optimize electrolytes   Hypokalemia- (present on admission) Replete Mag 1.9   COPD (chronic obstructive pulmonary disease) (HCC) Stable on RA Continue albuterol per home regimen   Leukocytosis- (present on admission) WBC 12.4, this is possibly a reactive process Due to stress demargination         Status is: Inpatient Remains inpatient appropriate because: severity of illness requiring IV opioids and work up no appropriate in out pt setting and intolerance of diet            Family Communication:  no Family at bedside  Consultants:  GI  Code Status:  FULL   DVT Prophylaxis: [place] Lovenox   Procedures: As Listed in Progress Note Above  Antibiotics: None        Subjective: Patient states that her abdominal pain is 30% better.  She still has some nausea but no further emesis.  She denies any headache, chest pain, shortness breath, hemoptysis, diarrhea, hematochezia, melena.  There is no dysuria or hematuria.  Objective: Vitals:   02/10/22 2050 02/10/22 2156 02/11/22 0146 02/11/22 0526  BP:  (!) 158/88 (!) 128/57 (!) 178/89  Pulse: 70 61 69 63  Resp: 15 18 19 19   Temp:  97.9 F (36.6 C) 98  F (36.7 C) 98.1 F (36.7 C)  TempSrc:   Oral Oral  SpO2: 93% 97% 100% 99%  Weight:      Height:       No intake or output data in the 24 hours ending 02/11/22 0811 Weight change:  Exam:  General:  Pt is alert, follows commands appropriately, not in acute distress HEENT: No icterus, No thrush, No neck mass, [place]/AT Cardiovascular: RRR, S1/S2, no rubs, no gallops Respiratory: CTA bilaterally, no wheezing, no crackles, no  rhonchi Abdomen: Soft/+BS, epigastric tender, non distended, no guarding Extremities: No edema, No lymphangitis, No petechiae, No rashes, no synovitis   Data Reviewed: I have personally reviewed following labs and imaging studies Basic Metabolic Panel: Recent Labs  Lab 02/10/22 1509 02/11/22 0523  NA 137 138  K 3.1* 4.6  CL 105 108  CO2 24 25  GLUCOSE 104* 121*  BUN 14 10  CREATININE 0.93 0.78  CALCIUM 9.2 8.8*  MG 2.0 1.9  PHOS  --  2.8   Liver Function Tests: Recent Labs  Lab 02/10/22 1509 02/11/22 0523  AST 17 12*  ALT 14 11  ALKPHOS 78 69  BILITOT 0.3 0.6  PROT 8.2* 6.6  ALBUMIN 4.2 3.4*   Recent Labs  Lab 02/10/22 1509  LIPASE 644*   No results for input(s): AMMONIA in the last 168 hours. Coagulation Profile: No results for input(s): INR, PROTIME in the last 168 hours. CBC: Recent Labs  Lab 02/10/22 1509 02/11/22 0523  WBC 12.4* 11.4*  NEUTROABS 8.5*  --   HGB 14.1 12.4  HCT 43.0 38.1  MCV 94.7 94.8  PLT 363 325   Cardiac Enzymes: No results for input(s): CKTOTAL, CKMB, CKMBINDEX, TROPONINI in the last 168 hours. BNP: Invalid input(s): POCBNP CBG: No results for input(s): GLUCAP in the last 168 hours. HbA1C: No results for input(s): HGBA1C in the last 72 hours. Urine analysis:    Component Value Date/Time   COLORURINE YELLOW 02/10/2022 [place] 02/10/2022 1509   LABSPEC 1.013 02/10/2022 1509   PHURINE 7.0 02/10/2022 1509   GLUCOSEU NEGATIVE 02/10/2022 1509   HGBUR NEGATIVE 02/10/2022 1509   BILIRUBINUR NEGATIVE 02/10/2022 1509   KETONESUR NEGATIVE 02/10/2022 1509   PROTEINUR NEGATIVE 02/10/2022 1509   UROBILINOGEN 0.2 06/08/2013 1115   NITRITE NEGATIVE 02/10/2022 1509   LEUKOCYTESUR NEGATIVE 02/10/2022 1509   Sepsis Labs: @LABRCNTIP (procalcitonin:4,lacticidven:4) ) Recent Results (from the past 240 hour(s))  Resp Panel by RT-PCR (Flu A&B, Covid) Nasopharyngeal Swab     Status: None   Collection Time: 02/10/22   3:10 PM   Specimen: Nasopharyngeal Swab; Nasopharyngeal(NP) swabs in vial transport medium  Result Value Ref Range Status   SARS Coronavirus 2 by RT PCR NEGATIVE NEGATIVE Final    Comment: (NOTE) SARS-CoV-2 target nucleic acids are NOT DETECTED.  The SARS-CoV-2 RNA is generally detectable in upper respiratory specimens during the acute phase of infection. The lowest concentration of SARS-CoV-2 viral copies this assay can detect is 138 copies/mL. A negative result does not preclude SARS-Cov-2 infection and should not be used as the sole basis for treatment or other patient management decisions. A negative result may occur with  improper specimen collection/handling, submission of specimen other than nasopharyngeal swab, presence of viral mutation(s) within the areas targeted by this assay, and inadequate number of viral copies(<138 copies/mL). A negative result must be combined with clinical observations, patient history, and epidemiological information. The expected result is Negative.  Fact Sheet for Patients:  EntrepreneurPulse.com.au  Fact  Sheet for Healthcare Providers:  IncredibleEmployment.be  This test is no t yet approved or cleared by the [place] FDA and  has been authorized for detection and/or diagnosis of SARS-CoV-2 by FDA under an Emergency Use Authorization (EUA). This EUA will remain  in effect (meaning this test can be used) for the duration of the COVID-19 declaration under Section 564(b)(1) of the Act, 21 U.S.C.section 360bbb-3(b)(1), unless the authorization is terminated  or revoked sooner.       Influenza A by PCR NEGATIVE NEGATIVE Final   Influenza B by PCR NEGATIVE NEGATIVE Final    Comment: (NOTE) The Xpert Xpress SARS-CoV-2/FLU/RSV plus assay is intended as an aid in the diagnosis of influenza from Nasopharyngeal swab specimens and should not be used as a sole basis for treatment. Nasal washings and aspirates  are unacceptable for Xpert Xpress SARS-CoV-2/FLU/RSV testing.  Fact Sheet for Patients: EntrepreneurPulse.com.au  Fact Sheet for Healthcare Providers: IncredibleEmployment.be  This test is not yet approved or cleared by the [place] FDA and has been authorized for detection and/or diagnosis of SARS-CoV-2 by FDA under an Emergency Use Authorization (EUA). This EUA will remain in effect (meaning this test can be used) for the duration of the COVID-19 declaration under Section 564(b)(1) of the Act, 21 U.S.C. section 360bbb-3(b)(1), unless the authorization is terminated or revoked.  Performed at [facility], [address], [place], [place] [address]      Scheduled Meds:  enoxaparin (LOVENOX) injection  40 mg Subcutaneous Q24H   pantoprazole (PROTONIX) IV  40 mg Intravenous Q24H   Continuous Infusions:  lactated ringers 125 mL/hr at 02/11/22 0254    Procedures/Studies: CT Angio Chest/Abd/Pel for Dissection W and/or Wo Contrast  Result Date: 02/10/2022 CLINICAL DATA:  Upper abdominal pain sharp pain and burning EXAM: CT ANGIOGRAPHY CHEST, ABDOMEN AND PELVIS TECHNIQUE: Non-contrast CT of the chest was initially obtained. Multidetector CT imaging through the chest, abdomen and pelvis was performed using the standard protocol during bolus administration of intravenous contrast. Multiplanar reconstructed images and MIPs were obtained and reviewed to evaluate the vascular anatomy. RADIATION DOSE REDUCTION: This exam was performed according to the departmental dose-optimization program which includes automated exposure control, adjustment of the mA and/or kV according to patient size and/or use of iterative reconstruction technique. CONTRAST:  166mL OMNIPAQUE IOHEXOL 350 MG/ML SOLN COMPARISON:  CT 10/17/2017, 06/08/2013 FINDINGS: CTA CHEST FINDINGS Cardiovascular: Non contrasted images of the chest demonstrate no acute intramural hematoma. Aorta is  nonaneurysmal. No dissection is seen. Moderate atherosclerosis. Left-sided arch with normal three-vessel origin. Normal cardiac size. No pericardial effusion Mediastinum/Nodes: Midline trachea. No thyroid mass. No suspicious lymph nodes. Esophagus within normal limits. Lungs/Pleura: Lungs are clear. No pleural effusion or pneumothorax. Musculoskeletal: Sternum is intact. No acute osseous abnormality is seen. Review of the MIP images confirms the above findings. CTA ABDOMEN AND PELVIS FINDINGS VASCULAR Aorta: Negative for dissection, aneurysm or occlusive disease. Moderate atherosclerosis. Celiac: Mild calcification at the origin without significant stenosis. Negative for aneurysm or occlusion. SMA: Mild origin calcification. Negative for aneurysm, dissection or stenosis. Renals: Single right and single left renal arteries are patent and without evidence for stenosis, aneurysm, or occlusion. IMA: Origin calcification. Distal vessel is patent and without stenosis. Inflow: Heavily calcified aortoiliac bifurcation. Moderate severe atherosclerosis of the common iliac vessels. No stenosis or occlusion. Negative for aneurysm. Veins: No obvious venous abnormality within the limitations of this arterial phase study. Review of the MIP images confirms the above findings. NON-VASCULAR Hepatobiliary: No calcified gallstone. No  focal hepatic abnormality. No biliary dilatation Pancreas: Atrophy of the body and tail of pancreas with ductal dilatation, progressed compared to prior. Numerous pancreatic calcifications likely due to chronic pancreatitis. Chunky calcification at the head of the pancreas measuring 17 mm, slightly increased in size, appears to be in the region of pancreatic duct. Indistinct masslike enlargement head of pancreas with mild stranding. Edema within the pancreaticoduodenal groove. Spleen: Normal in size without focal abnormality. Adrenals/Urinary Tract: Thickening of the adrenal glands without dominant mass.  Kidneys show no hydronephrosis. The bladder is normal Stomach/Bowel: Stomach nonenlarged. Mild wall thickening and edema at the second portion of duodenum. No extraluminal gas. Lymphatic: Mild porta hepatis lymph nodes. Reproductive: Status post hysterectomy. No adnexal masses. Other: Negative for pelvic effusion or free air. Musculoskeletal: No acute osseous abnormality Review of the MIP images confirms the above findings. IMPRESSION: 1. Negative for acute aortic dissection or aneurysm. Moderate atherosclerosis of the aorta. No hemodynamically significant stenosis within the vasculature of the abdomen or pelvis. 2. Masslike enlargement of the pancreatic head with mild stranding and edema. There is atrophy of the pancreatic body and tail with ductal dilatation. A large calcification at the head of the pancreas in the region of the duct may be slightly increased in size. Uncertain if findings are due to acute on chronic pancreatitis versus inflammatory pancreatic head mass. When the patient is clinically stable and able to follow directions and hold their breath (preferably as an outpatient) further evaluation with dedicated abdominal MRI should be considered. 3. Wall thickening and inflammation at the second portion of duodenum suggesting duodenitis versus reactive inflammation from adjacent pancreatic inflammation. Electronically Signed   By: Donavan Foil M.D.   On: 02/10/2022 17:06    Orson Eva, DO  [facility]  If 7PM-7AM, please contact night-coverage [email or web address] Password [email or web address] 02/11/2022, 8:11 AM   LOS: 1 day

## 2022-02-11 NOTE — Hospital Course (Addendum)
66 year old female with a history of hypertension, hypothyroidism, COPD, chronic pancreatitis with pancreatic duct stone presenting with 1 day of epigastric abdominal pain radiating to the back.  The patient states that this is similar to her prior episode of pancreatitis back in December 2013.  At that time, the patient was noted to have a dilated pancreatic duct with pancreatic duct stone measuring up to 13 mm.  She was ultimately seen at [facility] for pancreatic duct stone.  After period of follow-up, the patient did not have any exacerbations of her pancreatitis.  Therefore, observation and surveillance was opted as opposed to any surgical intervention.  The patient states that she has not had any episodes of pancreatitis up until this current episode.  She denies any new medications.  She rarely drinks alcohol.  She denies any over-the-counter supplements.  She denies any fever, chills, headache, chest pain, shortness breath, coughing, hemoptysis, diarrhea.  She did have 2 episodes of emesis since admission without blood. In the ED, the patient had low-grade temperature of 99.0 F.  She was hemodynamically stable.  Oxygen saturation was 100% room air.  BMP showed a sodium 137, potassium 4.1, bicarbonate 24, BUN 14, creatinine 0.93.  AST 17, ALT 14, alkaline phosphatase 70, total bilirubin 0.3, lipase 644.  CTA abdomen and pelvis showed chunky calcification in the pancreatic head but up to 17 mm.  There was masslike enlargement of the pancreatic head with surrounding stranding and edema.  There is pancreatic atrophy in the body and tail.  There was wall thickening in the second portion of the duodenum.  The patient was started on intravenous opiates on IV fluids.  GI was consulted to assist with management. The patient was treated with bowel rest, IVF and opioids.  She improved clinically and her diet was advanced which she tolerated.  She was seen by GI whom recommended MRI with MRCP in 6-7 weeks and EUS if MRCP  suggest head mass.

## 2022-02-11 NOTE — Assessment & Plan Note (Signed)
Tobacco cessation discussed 40+ pack year hx

## 2022-02-11 NOTE — Progress Notes (Signed)
°  Transition of Care [facility]) Screening Note   Patient Details  Name: Elizabeth [name] Date of Birth: [birth year]   Transition of Care [facility]) CM/SW Contact:    Ihor Gully, LCSW Phone Number: 02/11/2022, 1:33 PM    Transition of Care Department [facility]) has reviewed patient and no TOC needs have been identified at this time. We will continue to monitor patient advancement through interdisciplinary progression rounds. If new patient transition needs arise, please place a TOC consult.   Kesley Gaffey, Clydene Pugh, LCSW r

## 2022-02-12 ENCOUNTER — Telehealth: Payer: Self-pay | Admitting: Gastroenterology

## 2022-02-12 DIAGNOSIS — Z720 Tobacco use: Secondary | ICD-10-CM

## 2022-02-12 DIAGNOSIS — I10 Essential (primary) hypertension: Secondary | ICD-10-CM

## 2022-02-12 DIAGNOSIS — K859 Acute pancreatitis without necrosis or infection, unspecified: Secondary | ICD-10-CM

## 2022-02-12 DIAGNOSIS — K8689 Other specified diseases of pancreas: Secondary | ICD-10-CM

## 2022-02-12 LAB — COMPREHENSIVE METABOLIC PANEL
ALT: 9 U/L (ref 0–44)
AST: 13 U/L — ABNORMAL LOW (ref 15–41)
Albumin: 3.2 g/dL — ABNORMAL LOW (ref 3.5–5.0)
Alkaline Phosphatase: 62 U/L (ref 38–126)
Anion gap: 5 (ref 5–15)
BUN: 8 mg/dL (ref 8–23)
CO2: 25 mmol/L (ref 22–32)
Calcium: 8.8 mg/dL — ABNORMAL LOW (ref 8.9–10.3)
Chloride: 108 mmol/L (ref 98–111)
Creatinine, Ser: 0.83 mg/dL (ref 0.44–1.00)
GFR, Estimated: >60 mL/min (ref >60)
Glucose, Bld: 120 mg/dL — ABNORMAL HIGH (ref 70–99)
Potassium: 3.8 mmol/L (ref 3.5–5.1)
Sodium: 138 mmol/L (ref 135–145)
Total Bilirubin: 0.6 mg/dL (ref 0.3–1.2)
Total Protein: 6 g/dL — ABNORMAL LOW (ref 6.5–8.1)

## 2022-02-12 LAB — CBC
HCT: 35.4 % — ABNORMAL LOW (ref 36.0–46.0)
Hemoglobin: 11.5 g/dL — ABNORMAL LOW (ref 12.0–15.0)
MCH: 31.1 pg (ref 26.0–34.0)
MCHC: 32.5 g/dL (ref 30.0–36.0)
MCV: 95.7 fL (ref 80.0–100.0)
Platelets: 301 10*3/uL (ref 150–400)
RBC: 3.7 MIL/uL — ABNORMAL LOW (ref 3.87–5.11)
RDW: 14.6 % (ref 11.5–15.5)
WBC: 8.8 10*3/uL (ref 4.0–10.5)
nRBC: 0 % (ref 0.0–0.2)

## 2022-02-12 LAB — MAGNESIUM: Magnesium: 1.7 mg/dL (ref 1.7–2.4)

## 2022-02-12 MED ORDER — AMLODIPINE BESYLATE 5 MG PO TABS: 5.0000 mg | ORAL_TABLET | Freq: Every day | ORAL | 2 refills | Status: AC

## 2022-02-12 MED ORDER — AMLODIPINE BESYLATE 5 MG PO TABS
5.0000 mg | ORAL_TABLET | Freq: Every day | ORAL | Status: DC
  Administered 2022-02-12: 5 mg via ORAL
  Filled 2022-02-12: qty 1

## 2022-02-12 MED ORDER — HYDRALAZINE HCL 20 MG/ML IJ SOLN
10.0000 mg | Freq: Four times a day (QID) | INTRAMUSCULAR | Status: DC | PRN
  Administered 2022-02-12: 10 mg via INTRAVENOUS
  Filled 2022-02-12: qty 1

## 2022-02-12 NOTE — Discharge Summary (Addendum)
Physician Discharge Summary   Patient: Elizabeth [name] MRN: [id number] DOB: 28-May-1956  Admit date:     02/10/2022  Discharge date: 02/12/22  Discharge Physician: Shanon Brow Eather Chaires   PCP: Redmond School, MD   Recommendations at discharge:   Please follow up with primary care provider within 1-2 weeks  Please repeat BMP and CBC in one week      Hospital Course: 66 year old female with a history of hypertension, hypothyroidism, COPD, chronic pancreatitis with pancreatic duct stone presenting with 1 day of epigastric abdominal pain radiating to the back.  The patient states that this is similar to her prior episode of pancreatitis back in December 2013.  At that time, the patient was noted to have a dilated pancreatic duct with pancreatic duct stone measuring up to 13 mm.  She was ultimately seen at [facility] for pancreatic duct stone.  After period of follow-up, the patient did not have any exacerbations of her pancreatitis.  Therefore, observation and surveillance was opted as opposed to any surgical intervention.  The patient states that she has not had any episodes of pancreatitis up until this current episode.  She denies any new medications.  She rarely drinks alcohol.  She denies any over-the-counter supplements.  She denies any fever, chills, headache, chest pain, shortness breath, coughing, hemoptysis, diarrhea.  She did have 2 episodes of emesis since admission without blood. In the ED, the patient had low-grade temperature of 99.0 F.  She was hemodynamically stable.  Oxygen saturation was 100% room air.  BMP showed a sodium 137, potassium 4.1, bicarbonate 24, BUN 14, creatinine 0.93.  AST 17, ALT 14, alkaline phosphatase 70, total bilirubin 0.3, lipase 644.  CTA abdomen and pelvis showed chunky calcification in the pancreatic head but up to 17 mm.  There was masslike enlargement of the pancreatic head with surrounding stranding and edema.  There is pancreatic atrophy in the body and tail.  There was  wall thickening in the second portion of the duodenum.  The patient was started on intravenous opiates on IV fluids.  GI was consulted to assist with management. The patient was treated with bowel rest, IVF and opioids.  She improved clinically and her diet was advanced which she tolerated.  She was seen by GI whom recommended MRI with MRCP in 6-7 weeks and EUS if MRCP suggest head mass.  Assessment and Plan: * Acute on chronic pancreatitis ([place])- (present on admission) Continue IV Dilaudid 0.5 mg every 3 hours p.r.n for pain Continue Protonix Continue IV LR at 140ml/Hr Continue Clear liquid diet>>advanced to soft diet which she tolerated RUQ U/S--unremarkable CTA abd as discussed above GI consult appreciated -MRI with MRCP in 6-7 weeks and follow up at [facility] with Dr. Carlis Abbott - If MRI suggest mass lesion in addition to large PD stone, EUS may be helpful. -triglycerides 85  Tobacco abuse Tobacco cessation discussed 40+ pack year hx  Prolonged QT interval- (present on admission) QTc 593ms Optimize electrolytes   Hypokalemia- (present on admission) Repleted Mag 1.9   COPD (chronic obstructive pulmonary disease) (HCC) Stable on RA Continue albuterol per home regimen   Leukocytosis- (present on admission) WBC 12.4, this is possibly a reactive process Due to stress demargination Improved at time of d/c--8.8  HTN (hypertension)- (present on admission) SBP up to 190s during hospitalization Previously on anti-HTN meds which pt stopped Started amlodipine>>SBP 150s at time of d/c         Consultants: GI Procedures performed: none  Disposition: Home Diet recommendation:  Soft diet  DISCHARGE MEDICATION: Allergies as of 02/12/2022       Reactions   Codeine Itching, Other (See Comments)   Restlessness         Medication List     STOP taking these medications    guaiFENesin-dextromethorphan 100-10 MG/5ML syrup Commonly known as: ROBITUSSIN DM    oxyCODONE-acetaminophen 5-325 MG tablet Commonly known as: PERCOCET/ROXICET       TAKE these medications    albuterol 108 (90 Base) MCG/ACT inhaler Commonly known as: VENTOLIN HFA Inhale 2 puffs into the lungs every 6 (six) hours as needed for shortness of breath or wheezing. What changed: Another medication with the same name was removed. Continue taking this medication, and follow the directions you see here.   amLODipine 5 MG tablet Commonly known as: NORVASC Take 1 tablet (5 mg total) by mouth daily. Start taking on: February 13, 2022   guaiFENesin 600 MG 12 hr tablet Commonly known as: MUCINEX Take 600 mg by mouth 2 (two) times daily.   VITAMIN D3 PO Take 1 tablet by mouth daily.         Discharge Exam: Filed Weights   02/10/22 1501  Weight: 64.4 kg   HEENT:  [place]/AT, No thrush, no icterus CV:  RRR, no rub, no S3, no S4 Lung:  CTA, no wheeze, no rhonchi Abd:  soft/+BS, NT Ext:  No edema, no lymphangitis, no synovitis, no rash   Condition at discharge: stable  The results of significant diagnostics from this hospitalization (including imaging, microbiology, ancillary and laboratory) are listed below for reference.   Imaging Studies: CT Angio Chest/Abd/Pel for Dissection W and/or Wo Contrast  Result Date: 02/10/2022 CLINICAL DATA:  Upper abdominal pain sharp pain and burning EXAM: CT ANGIOGRAPHY CHEST, ABDOMEN AND PELVIS TECHNIQUE: Non-contrast CT of the chest was initially obtained. Multidetector CT imaging through the chest, abdomen and pelvis was performed using the standard protocol during bolus administration of intravenous contrast. Multiplanar reconstructed images and MIPs were obtained and reviewed to evaluate the vascular anatomy. RADIATION DOSE REDUCTION: This exam was performed according to the departmental dose-optimization program which includes automated exposure control, adjustment of the mA and/or kV according to patient size and/or use of iterative  reconstruction technique. CONTRAST:  128mL OMNIPAQUE IOHEXOL 350 MG/ML SOLN COMPARISON:  CT 10/17/2017, 06/08/2013 FINDINGS: CTA CHEST FINDINGS Cardiovascular: Non contrasted images of the chest demonstrate no acute intramural hematoma. Aorta is nonaneurysmal. No dissection is seen. Moderate atherosclerosis. Left-sided arch with normal three-vessel origin. Normal cardiac size. No pericardial effusion Mediastinum/Nodes: Midline trachea. No thyroid mass. No suspicious lymph nodes. Esophagus within normal limits. Lungs/Pleura: Lungs are clear. No pleural effusion or pneumothorax. Musculoskeletal: Sternum is intact. No acute osseous abnormality is seen. Review of the MIP images confirms the above findings. CTA ABDOMEN AND PELVIS FINDINGS VASCULAR Aorta: Negative for dissection, aneurysm or occlusive disease. Moderate atherosclerosis. Celiac: Mild calcification at the origin without significant stenosis. Negative for aneurysm or occlusion. SMA: Mild origin calcification. Negative for aneurysm, dissection or stenosis. Renals: Single right and single left renal arteries are patent and without evidence for stenosis, aneurysm, or occlusion. IMA: Origin calcification. Distal vessel is patent and without stenosis. Inflow: Heavily calcified aortoiliac bifurcation. Moderate severe atherosclerosis of the common iliac vessels. No stenosis or occlusion. Negative for aneurysm. Veins: No obvious venous abnormality within the limitations of this arterial phase study. Review of the MIP images confirms the above findings. NON-VASCULAR Hepatobiliary: No calcified gallstone. No focal hepatic abnormality. No biliary dilatation Pancreas: Atrophy  of the body and tail of pancreas with ductal dilatation, progressed compared to prior. Numerous pancreatic calcifications likely due to chronic pancreatitis. Chunky calcification at the head of the pancreas measuring 17 mm, slightly increased in size, appears to be in the region of pancreatic duct.  Indistinct masslike enlargement head of pancreas with mild stranding. Edema within the pancreaticoduodenal groove. Spleen: Normal in size without focal abnormality. Adrenals/Urinary Tract: Thickening of the adrenal glands without dominant mass. Kidneys show no hydronephrosis. The bladder is normal Stomach/Bowel: Stomach nonenlarged. Mild wall thickening and edema at the second portion of duodenum. No extraluminal gas. Lymphatic: Mild porta hepatis lymph nodes. Reproductive: Status post hysterectomy. No adnexal masses. Other: Negative for pelvic effusion or free air. Musculoskeletal: No acute osseous abnormality Review of the MIP images confirms the above findings. IMPRESSION: 1. Negative for acute aortic dissection or aneurysm. Moderate atherosclerosis of the aorta. No hemodynamically significant stenosis within the vasculature of the abdomen or pelvis. 2. Masslike enlargement of the pancreatic head with mild stranding and edema. There is atrophy of the pancreatic body and tail with ductal dilatation. A large calcification at the head of the pancreas in the region of the duct may be slightly increased in size. Uncertain if findings are due to acute on chronic pancreatitis versus inflammatory pancreatic head mass. When the patient is clinically stable and able to follow directions and hold their breath (preferably as an outpatient) further evaluation with dedicated abdominal MRI should be considered. 3. Wall thickening and inflammation at the second portion of duodenum suggesting duodenitis versus reactive inflammation from adjacent pancreatic inflammation. Electronically Signed   By: Donavan Foil M.D.   On: 02/10/2022 17:06   US Abdomen Limited RUQ (LIVER/GB)  Result Date: 02/11/2022 CLINICAL DATA:  Acute pancreatitis EXAM: ULTRASOUND ABDOMEN LIMITED RIGHT UPPER QUADRANT COMPARISON:  None. FINDINGS: Gallbladder: No gallstones or wall thickening visualized. No sonographic Murphy sign noted by sonographer. Common  bile duct: Diameter: 4 mm, normal Liver: No focal lesion identified. Within normal limits in parenchymal echogenicity. Portal vein is patent on color Doppler imaging with normal direction of blood flow towards the liver. Other: None. IMPRESSION: Unremarkable right upper quadrant ultrasound. Electronically Signed   By: Macy Mis M.D.   On: 02/11/2022 09:11    Microbiology: Results for orders placed or performed during the hospital encounter of 02/10/22  Resp Panel by RT-PCR (Flu A&B, Covid) Nasopharyngeal Swab     Status: None   Collection Time: 02/10/22  3:10 PM   Specimen: Nasopharyngeal Swab; Nasopharyngeal(NP) swabs in vial transport medium  Result Value Ref Range Status   SARS Coronavirus 2 by RT PCR NEGATIVE NEGATIVE Final    Comment: (NOTE) SARS-CoV-2 target nucleic acids are NOT DETECTED.  The SARS-CoV-2 RNA is generally detectable in upper respiratory specimens during the acute phase of infection. The lowest concentration of SARS-CoV-2 viral copies this assay can detect is 138 copies/mL. A negative result does not preclude SARS-Cov-2 infection and should not be used as the sole basis for treatment or other patient management decisions. A negative result may occur with  improper specimen collection/handling, submission of specimen other than nasopharyngeal swab, presence of viral mutation(s) within the areas targeted by this assay, and inadequate number of viral copies(<138 copies/mL). A negative result must be combined with clinical observations, patient history, and epidemiological information. The expected result is Negative.  Fact Sheet for Patients:  EntrepreneurPulse.com.au  Fact Sheet for Healthcare Providers:  IncredibleEmployment.be  This test is no t yet approved or cleared by  the [employer] and  has been authorized for detection and/or diagnosis of SARS-CoV-2 by FDA under an Emergency Use Authorization (EUA). This EUA  will remain  in effect (meaning this test can be used) for the duration of the COVID-19 declaration under Section 564(b)(1) of the Act, 21 U.S.C.section 360bbb-3(b)(1), unless the authorization is terminated  or revoked sooner.       Influenza A by PCR NEGATIVE NEGATIVE Final   Influenza B by PCR NEGATIVE NEGATIVE Final    Comment: (NOTE) The Xpert Xpress SARS-CoV-2/FLU/RSV plus assay is intended as an aid in the diagnosis of influenza from Nasopharyngeal swab specimens and should not be used as a sole basis for treatment. Nasal washings and aspirates are unacceptable for Xpert Xpress SARS-CoV-2/FLU/RSV testing.  Fact Sheet for Patients: EntrepreneurPulse.com.au  Fact Sheet for Healthcare Providers: IncredibleEmployment.be  This test is not yet approved or cleared by the [place] FDA and has been authorized for detection and/or diagnosis of SARS-CoV-2 by FDA under an Emergency Use Authorization (EUA). This EUA will remain in effect (meaning this test can be used) for the duration of the COVID-19 declaration under Section 564(b)(1) of the Act, 21 U.S.C. section 360bbb-3(b)(1), unless the authorization is terminated or revoked.  Performed at [facility], [address], [place], [place] [address]     Labs: CBC: Recent Labs  Lab 02/10/22 1509 02/11/22 0523 02/12/22 0546  WBC 12.4* 11.4* 8.8  NEUTROABS 8.5*  --   --   HGB 14.1 12.4 11.5*  HCT 43.0 38.1 35.4*  MCV 94.7 94.8 95.7  PLT 363 325 702   Basic Metabolic Panel: Recent Labs  Lab 02/10/22 1509 02/11/22 0523 02/12/22 0546  NA 137 138 138  K 3.1* 4.6 3.8  CL 105 108 108  CO2 24 25 25   GLUCOSE 104* 121* 120*  BUN 14 10 8   CREATININE 0.93 0.78 0.83  CALCIUM 9.2 8.8* 8.8*  MG 2.0 1.9 1.7  PHOS  --  2.8  --    Liver Function Tests: Recent Labs  Lab 02/10/22 1509 02/11/22 0523 02/12/22 0546  AST 17 12* 13*  ALT 14 11 9   ALKPHOS 78 69 62  BILITOT 0.3 0.6 0.6   PROT 8.2* 6.6 6.0*  ALBUMIN 4.2 3.4* 3.2*   CBG: No results for input(s): GLUCAP in the last 168 hours.  Discharge time spent: greater than 30 minutes.  Signed: Orson Eva, MD [facility] 02/12/2022

## 2022-02-12 NOTE — Progress Notes (Signed)
Subjective: Doing very well this morning.  Denies abdominal pain, nausea, vomiting.  She has tolerated her clear liquid diet well and is ready to advance her diet and go home today if possible.  She has not had a bowel movement but is passing gas.  She was found to have elevated blood pressure today at 193/108 and states that has been her only problem.  Objective: Vital signs in last 24 hours: Temp:  [97.4 F (36.3 C)-98.7 F (37.1 C)] 98.7 F (37.1 C) (02/24 1156) Pulse Rate:  [66-79] 66 (02/24 1156) Resp:  [17-19] 18 (02/24 1156) BP: (129-193)/(75-108) 185/75 (02/24 1156) SpO2:  [96 %-100 %] 97 % (02/24 1156) Last BM Date : 02/10/22 General:   Alert and oriented, pleasant, no acute distress. Head:  Normocephalic and atraumatic. Eyes:  No icterus, sclera clear. Conjuctiva pink.  Abdomen:  Bowel sounds present, soft, non-tender, non-distended. No HSM or hernias noted. No rebound or guarding. No masses appreciated  Msk:  Symmetrical without gross deformities. Normal posture. Extremities:  Without edema. Neurologic:  Alert and  oriented x4;  grossly normal neurologically. Skin:  Warm and dry, intact without significant lesions.  Psych:  Normal mood and affect.  Intake/Output from previous day: 02/23 0701 - 02/24 0700 In: 3905.9 [P.O.:1790; I.V.:2115.9] Out: -  Intake/Output this shift: Total I/O In: 957 [P.O.:957] Out: -   Lab Results: Recent Labs    02/10/22 1509 02/11/22 0523 02/12/22 0546  WBC 12.4* 11.4* 8.8  HGB 14.1 12.4 11.5*  HCT 43.0 38.1 35.4*  PLT 363 325 301   BMET Recent Labs    02/10/22 1509 02/11/22 0523 02/12/22 0546  NA 137 138 138  K 3.1* 4.6 3.8  CL 105 108 108  CO2 24 25 25   GLUCOSE 626* 948* 120*  BUN 14 10 8   CREATININE 0.93 0.78 0.83  CALCIUM 9.2 8.8* 8.8*   LFT Recent Labs    02/10/22 1509 02/11/22 0523 02/12/22 0546  PROT 8.2* 6.6 6.0*  ALBUMIN 4.2 3.4* 3.2*  AST 17 12* 13*  ALT 14 11 9   ALKPHOS 78 69 62  BILITOT 0.3  0.6 0.6   Studies/Results: CT Angio Chest/Abd/Pel for Dissection W and/or Wo Contrast  Result Date: 02/10/2022 CLINICAL DATA:  Upper abdominal pain sharp pain and burning EXAM: CT ANGIOGRAPHY CHEST, ABDOMEN AND PELVIS TECHNIQUE: Non-contrast CT of the chest was initially obtained. Multidetector CT imaging through the chest, abdomen and pelvis was performed using the standard protocol during bolus administration of intravenous contrast. Multiplanar reconstructed images and MIPs were obtained and reviewed to evaluate the vascular anatomy. RADIATION DOSE REDUCTION: This exam was performed according to the departmental dose-optimization program which includes automated exposure control, adjustment of the mA and/or kV according to patient size and/or use of iterative reconstruction technique. CONTRAST:  186mL OMNIPAQUE IOHEXOL 350 MG/ML SOLN COMPARISON:  CT 10/17/2017, 06/08/2013 FINDINGS: CTA CHEST FINDINGS Cardiovascular: Non contrasted images of the chest demonstrate no acute intramural hematoma. Aorta is nonaneurysmal. No dissection is seen. Moderate atherosclerosis. Left-sided arch with normal three-vessel origin. Normal cardiac size. No pericardial effusion Mediastinum/Nodes: Midline trachea. No thyroid mass. No suspicious lymph nodes. Esophagus within normal limits. Lungs/Pleura: Lungs are clear. No pleural effusion or pneumothorax. Musculoskeletal: Sternum is intact. No acute osseous abnormality is seen. Review of the MIP images confirms the above findings. CTA ABDOMEN AND PELVIS FINDINGS VASCULAR Aorta: Negative for dissection, aneurysm or occlusive disease. Moderate atherosclerosis. Celiac: Mild calcification at the origin without significant stenosis. Negative for aneurysm or  occlusion. SMA: Mild origin calcification. Negative for aneurysm, dissection or stenosis. Renals: Single right and single left renal arteries are patent and without evidence for stenosis, aneurysm, or occlusion. IMA: Origin  calcification. Distal vessel is patent and without stenosis. Inflow: Heavily calcified aortoiliac bifurcation. Moderate severe atherosclerosis of the common iliac vessels. No stenosis or occlusion. Negative for aneurysm. Veins: No obvious venous abnormality within the limitations of this arterial phase study. Review of the MIP images confirms the above findings. NON-VASCULAR Hepatobiliary: No calcified gallstone. No focal hepatic abnormality. No biliary dilatation Pancreas: Atrophy of the body and tail of pancreas with ductal dilatation, progressed compared to prior. Numerous pancreatic calcifications likely due to chronic pancreatitis. Chunky calcification at the head of the pancreas measuring 17 mm, slightly increased in size, appears to be in the region of pancreatic duct. Indistinct masslike enlargement head of pancreas with mild stranding. Edema within the pancreaticoduodenal groove. Spleen: Normal in size without focal abnormality. Adrenals/Urinary Tract: Thickening of the adrenal glands without dominant mass. Kidneys show no hydronephrosis. The bladder is normal Stomach/Bowel: Stomach nonenlarged. Mild wall thickening and edema at the second portion of duodenum. No extraluminal gas. Lymphatic: Mild porta hepatis lymph nodes. Reproductive: Status post hysterectomy. No adnexal masses. Other: Negative for pelvic effusion or free air. Musculoskeletal: No acute osseous abnormality Review of the MIP images confirms the above findings. IMPRESSION: 1. Negative for acute aortic dissection or aneurysm. Moderate atherosclerosis of the aorta. No hemodynamically significant stenosis within the vasculature of the abdomen or pelvis. 2. Masslike enlargement of the pancreatic head with mild stranding and edema. There is atrophy of the pancreatic body and tail with ductal dilatation. A large calcification at the head of the pancreas in the region of the duct may be slightly increased in size. Uncertain if findings are due to  acute on chronic pancreatitis versus inflammatory pancreatic head mass. When the patient is clinically stable and able to follow directions and hold their breath (preferably as an outpatient) further evaluation with dedicated abdominal MRI should be considered. 3. Wall thickening and inflammation at the second portion of duodenum suggesting duodenitis versus reactive inflammation from adjacent pancreatic inflammation. Electronically Signed   By: Donavan Foil M.D.   On: 02/10/2022 17:06   US Abdomen Limited RUQ (LIVER/GB)  Result Date: 02/11/2022 CLINICAL DATA:  Acute pancreatitis EXAM: ULTRASOUND ABDOMEN LIMITED RIGHT UPPER QUADRANT COMPARISON:  None. FINDINGS: Gallbladder: No gallstones or wall thickening visualized. No sonographic Murphy sign noted by sonographer. Common bile duct: Diameter: 4 mm, normal Liver: No focal lesion identified. Within normal limits in parenchymal echogenicity. Portal vein is patent on color Doppler imaging with normal direction of blood flow towards the liver. Other: None. IMPRESSION: Unremarkable right upper quadrant ultrasound. Electronically Signed   By: Macy Mis M.D.   On: 02/11/2022 09:11    Assessment: 66 year old female with history of HTN, hypothyroidism, arthritis, chronic pancreatitis with known pancreatic duct stone since 2013 with failed ERCP at [facility] with Dr. Carlis Abbott, now admitted with acute on chronic pancreatitis, CT with evidence of pancreatic atrophy and ductal dilation, numerous pancreatic calcifications, chunky calcification at the head of the pancreas measuring 17 mm in the region of the pancreatic duct, indistinct masslike enlargement head of pancreas with mild stranding and edema within the pancreaticoduodenal groove. [place] without cholelithiasis, CBD within normal limits.  Triglycerides 85.  No significant alcohol use. Clinically, she has improved significantly with resolution of abdominal pain.  Denies nausea or vomiting and is tolerating a  clear liquid  diet well.  Mild leukocytosis resolved.  She is asking to go home today if possible.  There is no indication for surgical intervention at this time.  Dr. Abbey Chatters spoke with Dr. Ardis Hughs who recommended letting patient recover from her acute illness, then get an interval MRI with MRCP in 6-7 weeks.  As she is established with [facility] with very well experience pancreatic surgeon, Dr. Carlis Abbott, recommended follow-up with him in a couple of months.  Suspected patient may need some type of pancreatic surgery such as Whipple or peustow type procedure.  If MRI suggest mass lesion in addition to large PD stone, EUS may be helpful.  At this time, I will advance her diet to soft. If she tolerates this well, should be ok to go home from a GI standpoint.   Plan: Advance to soft diet.  If patient tolerates this well, should be okay to go home from a GI standpoint. D/C IV fluids.  Continue supportive measures PRN.  Needs MRI with MRCP in 6-7 weeks. Consider EUS if evidence of pancreatic mass lesion on MRI. Refer back to Dr. Carlis Abbott with [facility] for follow-up, hopefully within the next couple of months.   LOS: 2 days    02/12/2022, 12:25 PM   Elizabeth [name], [facility]

## 2022-02-12 NOTE — Care Management Important Message (Signed)
Important Message  Patient Details  Name: Elizabeth [name] MRN: [id number] Date of Birth: 1956-08-17   Medicare Important Message Given:  Yes     Tommy Medal 02/12/2022, 1:45 PM

## 2022-02-12 NOTE — Progress Notes (Signed)
BP 193/108, Notified Dr. Carles Collet. N.O for amlodipine 5mg . Medication given. Will recheck BP in approx 1 hour.

## 2022-02-12 NOTE — Telephone Encounter (Signed)
Patient was discharged from the hospital today.  She was admitted with acute on chronic pancreatitis.  Has known pancreatic duct stone, concern for pancreatic head mass lesion.  Needs follow-up in 3-4 weeks. MRI/MRCP in 6-7 weeks.  Also needs referral back to [facility] health to see Dr. Carlis Abbott due to history of pancreatic duct stone.    Stacey: Please arrange follow-up in 3-4 weeks with an APP or Dr. Abbey Chatters.  [facility] Clinical Pool:  Please arrange MRI/MRCP abdomen in 6-7 weeks.  Dx: Follow-up acute on chronic pancreatitis, pancreatic duct stone, evaluate for possible pancreatic head mass. Please refer back to Dr. Carlis Abbott with [facility] due to history of pancreatic duct stone, recent acute on chronic pancreatitis.

## 2022-02-12 NOTE — Assessment & Plan Note (Signed)
SBP up to 190s during hospitalization Previously on anti-HTN meds which pt stopped Started amlodipine>>SBP 150s at time of d/c

## 2022-02-15 NOTE — Telephone Encounter (Signed)
Called pt. Gave MRI appt details. She voiced understanding and had no questions.

## 2022-02-15 NOTE — Addendum Note (Signed)
Addended by: Cheron Every on: 02/15/2022 08:22 AM   Modules accepted: Orders

## 2022-02-15 NOTE — Telephone Encounter (Signed)
Referral sent to [facility]. Will call to schedule MRI,MRCP

## 2022-02-18 DIAGNOSIS — K858 Other acute pancreatitis without necrosis or infection: Secondary | ICD-10-CM | POA: Diagnosis not present

## 2022-02-18 DIAGNOSIS — I10 Essential (primary) hypertension: Secondary | ICD-10-CM | POA: Diagnosis not present

## 2022-02-18 DIAGNOSIS — J441 Chronic obstructive pulmonary disease with (acute) exacerbation: Secondary | ICD-10-CM | POA: Diagnosis not present

## 2022-02-18 DIAGNOSIS — E6609 Other obesity due to excess calories: Secondary | ICD-10-CM | POA: Diagnosis not present

## 2022-02-18 DIAGNOSIS — Z6829 Body mass index (BMI) 29.0-29.9, adult: Secondary | ICD-10-CM | POA: Diagnosis not present

## 2022-03-15 DIAGNOSIS — F17210 Nicotine dependence, cigarettes, uncomplicated: Secondary | ICD-10-CM | POA: Diagnosis not present

## 2022-03-15 DIAGNOSIS — K8689 Other specified diseases of pancreas: Secondary | ICD-10-CM | POA: Diagnosis not present

## 2022-03-15 DIAGNOSIS — K861 Other chronic pancreatitis: Secondary | ICD-10-CM | POA: Diagnosis not present

## 2022-03-15 DIAGNOSIS — J449 Chronic obstructive pulmonary disease, unspecified: Secondary | ICD-10-CM | POA: Diagnosis not present

## 2022-03-15 DIAGNOSIS — I10 Essential (primary) hypertension: Secondary | ICD-10-CM | POA: Diagnosis not present

## 2022-03-15 DIAGNOSIS — E039 Hypothyroidism, unspecified: Secondary | ICD-10-CM | POA: Diagnosis not present

## 2022-03-25 ENCOUNTER — Encounter: Payer: Self-pay | Admitting: Internal Medicine

## 2022-03-25 ENCOUNTER — Ambulatory Visit: Payer: PPO | Admitting: Internal Medicine

## 2022-03-25 VITALS — BP 118/70 | HR 59 | Temp 97.1°F | Ht 59.0 in | Wt 149.0 lb

## 2022-03-25 DIAGNOSIS — K5900 Constipation, unspecified: Secondary | ICD-10-CM | POA: Diagnosis not present

## 2022-03-25 DIAGNOSIS — Z720 Tobacco use: Secondary | ICD-10-CM | POA: Diagnosis not present

## 2022-03-25 DIAGNOSIS — K861 Other chronic pancreatitis: Secondary | ICD-10-CM | POA: Diagnosis not present

## 2022-03-25 NOTE — Patient Instructions (Addendum)
I am happy to hear that you are feeling better. ? ?I will check CA 19-9 at [facility] today. ? ?Proceed with MRI in a few weeks which is already scheduled. ? ?For your constipation, I want you to start taking over the counter MiraLAX 1 capful daily.  If this does not adequately control your constipation, I would increase to 2 capfuls daily.  If this is still not adequate, then I would add on once daily Dulcolax (bisacodyl) tablet. ?  ?I also recommend increasing fiber in your diet or adding OTC Benefiber/Metamucil. Be sure to drink at least 4 to 6 glasses of water daily.  ? ?Follow-up with GI in 4 months. ? ?It was nice seeing you again today. ? ?Dr. Abbey Chatters ? ?At [facility] we value your feedback. You may receive a survey about your visit today. Please share your experience as we strive to create trusting relationships with our patients to provide genuine, compassionate, quality care. ? ?We appreciate your understanding and patience as we review any laboratory studies, imaging, and other diagnostic tests that are ordered as we care for you. Our office policy is 5 business days for review of these results, and any emergent or urgent results are addressed in a timely manner for your best interest. If you do not hear from our office in 1 week, please contact us.  ? ?We also encourage the use of MyChart, which contains your medical information for your review as well. If you are not enrolled in this feature, an access code is on this after visit summary for your convenience. Thank you for allowing [place] to be involved in your care. ? ?It was great to see you today!  I hope you have a great rest of your Spring! ? ? ? ?Elizabeth [name]. Abbey Chatters, D.O. ?Gastroenterology and Hepatology ?[facility] ? ?

## 2022-03-25 NOTE — Progress Notes (Signed)
? ? ?Referring Provider: Redmond School, MD ?Primary Care Physician:  Redmond School, MD ?Primary GI:  Dr. Abbey Chatters ? ?Chief Complaint  ?Patient presents with  ? Hospitalization Follow-up  ?  Chronic pancreatitis  ? ? ?HPI:   ?Elizabeth [name] is a 66 y.o. female who presents to clinic today for hospital follow-up visit.  Has a history of chronic pancreatitis, chronic pancreatic ductal dilatation with pancreatic ductal stone.  Previously seen by Dr. Carlis Abbott at [facility]. ? ?Was admitted to [facility] 02/10/2022 for acute on chronic pancreatitis after presenting with abdominal pain x2 to 3 days. CT with evidence of pancreatic duct dilation and 17 mm pancreatic stone, atrophy, numerous pancreatic calcifications, and a mass-like enhancement at the head of the pancreas. No history of significant alcohol use. She has failed ERCP in the past but improved immediately post attempt, had no pain during her follow up appointments with little change in her imaging so no consideration of repeat ERCP or surgical intervention at that time ? ?Improved with conservative measures and was discharged home.  During her hospitalization, I did discuss case further with advanced endoscopy in [facility] who recommended repeat imaging in 6 weeks with MRI/MRCP as well as follow-up with Dr. Carlis Abbott her surgeon at [facility]. ? ?Recently saw Dr. Carlis Abbott 03/15/2022 who agreed with repeat imaging and recommended adding on CA 19-9. ? ?Today, patient states she is doing great.  No abdominal pain.  Tolerating diet.  Does complain of constipation which is mild.  We will have a bowel movement once every 2 to 3 days.  No straining.  No melena hematochezia.  Last colonoscopy 2014 unremarkable with 10-year recall recommended. ? ?Past Medical History:  ?Diagnosis Date  ? Arthritis   ? Back pain   ? Hypertension   ? Hypothyroidism   ? Pancreatitis due to common bile duct stone 11/2012  ? ? ?Past Surgical History:  ?Procedure Laterality  Date  ? ABDOMINAL HYSTERECTOMY    ? APPENDECTOMY    ? COLONOSCOPY N/A 01/31/2013  ? Procedure: COLONOSCOPY;  Surgeon: Rogene Houston, MD;  Location: [facility];  Service: Endoscopy;  Laterality: N/A;  100  ? ERCP    ? HERNIA REPAIR    ? Right rotator cuff repair    ? TUBAL LIGATION    ? ? ?Current Outpatient Medications  ?Medication Sig Dispense Refill  ? albuterol (VENTOLIN HFA) 108 (90 Base) MCG/ACT inhaler Inhale 2 puffs into the lungs every 6 (six) hours as needed for shortness of breath or wheezing.    ? amLODipine (NORVASC) 5 MG tablet Take 1 tablet (5 mg total) by mouth daily. 30 tablet 2  ? Cholecalciferol (VITAMIN D3 PO) Take 1 tablet by mouth daily.    ? guaiFENesin (MUCINEX) 600 MG 12 hr tablet Take 600 mg by mouth 2 (two) times daily. (Patient not taking: Reported on 03/25/2022)    ? ?No current facility-administered medications for this visit.  ? ? ?Allergies as of 03/25/2022 - Review Complete 03/25/2022  ?Allergen Reaction Noted  ? Codeine Itching and Other (See Comments) 12/04/2012  ? ? ?Family History  ?Problem Relation Age of Onset  ? Ovarian cancer Mother   ? COPD Father   ? Diabetes Brother   ? Liver cancer Brother   ? Hypertension Sister   ? Mental illness Sister   ? Hypertension Son   ? Heart attack Maternal Grandfather   ? Other Maternal Grandfather   ?     had  a trach  ? Colon cancer Neg Hx   ? ? ?Social History  ? ?Socioeconomic History  ? Marital status: Divorced  ?  Spouse name: Not on file  ? Number of children: 2  ? Years of education: Not on file  ? Highest education level: Not on file  ?Occupational History  ?  Employer: [employer]  ?Tobacco Use  ? Smoking status: Every Day  ?  Packs/day: 1.00  ?  Years: 41.00  ?  Pack years: 41.00  ?  Types: Cigarettes  ? Smokeless tobacco: Never  ?Substance and Sexual Activity  ? Alcohol use: Yes  ?  Comment: rarely  ? Drug use: No  ? Sexual activity: Not Currently  ?  Birth control/protection: Surgical  ?  Comment: hyst  ?Other Topics  Concern  ? Not on file  ?Social History Narrative  ? Not on file  ? ?Social Determinants of Health  ? ?Financial Resource Strain: Not on file  ?Food Insecurity: Not on file  ?Transportation Needs: Not on file  ?Physical Activity: Not on file  ?Stress: Not on file  ?Social Connections: Not on file  ? ? ?Subjective: ?Review of Systems  ?Constitutional:  Negative for chills and fever.  ?HENT:  Negative for congestion and hearing loss.   ?Eyes:  Negative for blurred vision and double vision.  ?Respiratory:  Negative for cough and shortness of breath.   ?Cardiovascular:  Negative for chest pain and palpitations.  ?Gastrointestinal:  Negative for abdominal pain, blood in stool, constipation, diarrhea, heartburn, melena and vomiting.  ?Genitourinary:  Negative for dysuria and urgency.  ?Musculoskeletal:  Negative for joint pain and myalgias.  ?Skin:  Negative for itching and rash.  ?Neurological:  Negative for dizziness and headaches.  ?Psychiatric/Behavioral:  Negative for depression. The patient is not nervous/anxious.   ? ? ?Objective: ?BP 118/70   Pulse (!) 59   Temp (!) 97.1 ?F (36.2 ?C)   Ht '4\' 11"'$  (1.499 m)   Wt 149 lb (67.6 kg)   BMI 30.09 kg/m?  ?Physical Exam ?Constitutional:   ?   Appearance: Normal appearance.  ?HENT:  ?   Head: Normocephalic and atraumatic.  ?Eyes:  ?   Extraocular Movements: Extraocular movements intact.  ?   Conjunctiva/sclera: Conjunctivae normal.  ?Cardiovascular:  ?   Rate and Rhythm: Normal rate and regular rhythm.  ?Pulmonary:  ?   Effort: Pulmonary effort is normal.  ?   Breath sounds: Normal breath sounds.  ?Abdominal:  ?   General: Bowel sounds are normal.  ?   Palpations: Abdomen is soft.  ?Musculoskeletal:     ?   General: No swelling. Normal range of motion.  ?   Cervical back: Normal range of motion and neck supple.  ?Skin: ?   General: Skin is warm and dry.  ?   Coloration: Skin is not jaundiced.  ?Neurological:  ?   General: No focal deficit present.  ?   Mental Status:  She is alert and oriented to person, place, and time.  ?Psychiatric:     ?   Mood and Affect: Mood normal.     ?   Behavior: Behavior normal.  ? ? ? ?Assessment: ?*Chronic pancreatitis ?*Pancreatic duct stone with subsequent dilatation ?*Constipation ? ?Plan: ?Discussed patient's CT findings in depth with her today.  Case was also discussed with advanced endoscopy in [facility] during her hospitalization.  Patient is also recently seen Dr. Carlis Abbott at [facility]. ? ?MRI/MRCP already scheduled for 04/05/2022. ? ?  We will check CA 19-9 today. ? ?For patient's constipation, I recommended taking MiraLAX 1 capful daily.  If this is not adequate then increase to 2 capfuls daily.  If this is still not adequate then add on once daily Dulcolax. ? ?Also recommended patient start taking fiber therapy.  Print out given to patient today.  Encouraged to drink at least 6 glasses of water daily. ? ?Completely asymptomatic today from a pain standpoint.  No diarrhea.  We will hold off on pancreatic enzyme replacement therapy. ? ?Counseled on tobacco cessation. ? ?Colonoscopy recall for colon cancer screening purposes 2024. ? ?Further recommendations to follow. ? ?03/25/2022 1:30 PM ? ? ?Disclaimer: This note was dictated with voice recognition software. Similar sounding words can inadvertently be transcribed and may not be corrected upon review. ? ?

## 2022-03-26 LAB — CANCER ANTIGEN 19-9: CA 19-9: 12 U/mL (ref 0–35)

## 2022-04-05 ENCOUNTER — Ambulatory Visit (HOSPITAL_COMMUNITY)
Admission: RE | Admit: 2022-04-05 | Discharge: 2022-04-05 | Disposition: A | Discharge status: Home / Self Care | Payer: PPO | Source: Ambulatory Visit | Attending: Gastroenterology | Admitting: Gastroenterology

## 2022-04-05 ENCOUNTER — Other Ambulatory Visit: Payer: Self-pay | Admitting: Gastroenterology

## 2022-04-05 DIAGNOSIS — K859 Acute pancreatitis without necrosis or infection, unspecified: Secondary | ICD-10-CM

## 2022-04-05 DIAGNOSIS — K861 Other chronic pancreatitis: Secondary | ICD-10-CM | POA: Diagnosis not present

## 2022-04-05 DIAGNOSIS — K8689 Other specified diseases of pancreas: Secondary | ICD-10-CM

## 2022-04-05 DIAGNOSIS — R935 Abnormal findings on diagnostic imaging of other abdominal regions, including retroperitoneum: Secondary | ICD-10-CM | POA: Diagnosis not present

## 2022-04-05 MED ORDER — GADOBUTROL 1 MMOL/ML IV SOLN
7.0000 mL | Freq: Once | INTRAVENOUS | Status: AC | PRN
  Administered 2022-04-05: 7 mL via INTRAVENOUS

## 2022-07-05 ENCOUNTER — Encounter: Payer: Self-pay | Admitting: Internal Medicine

## 2022-08-06 ENCOUNTER — Other Ambulatory Visit (HOSPITAL_COMMUNITY): Payer: Self-pay | Admitting: Internal Medicine

## 2022-08-06 DIAGNOSIS — Z1231 Encounter for screening mammogram for malignant neoplasm of breast: Secondary | ICD-10-CM

## 2022-08-11 ENCOUNTER — Ambulatory Visit (HOSPITAL_COMMUNITY)
Admission: RE | Admit: 2022-08-11 | Discharge: 2022-08-11 | Disposition: A | Discharge status: Home / Self Care | Payer: PPO | Source: Ambulatory Visit | Attending: Internal Medicine | Admitting: Internal Medicine

## 2022-08-11 DIAGNOSIS — Z1231 Encounter for screening mammogram for malignant neoplasm of breast: Secondary | ICD-10-CM | POA: Diagnosis not present

## 2022-08-25 IMAGING — CT CT ANGIO CHEST-ABD-PELV FOR DISSECTION W/ AND WO/W CM
2 of 7 series · 13 of 46 positions shown, 15 images · IV contrast (Omnipaque or Isovue)
Comparison: CT 10/17/2017, 06/08/2013

CLINICAL DATA: Upper abdominal pain sharp pain and burning

EXAM:
CT ANGIOGRAPHY CHEST, ABDOMEN AND PELVIS
TECHNIQUE: Non-contrast CT of the chest was initially obtained.

[Series 5: axial arterial · axial · arterial · 0.75mm/px · z∈[+794,+1316]mm · 10 of 204 slices shown, 12 images]
[im 15/204  soft-tissue]
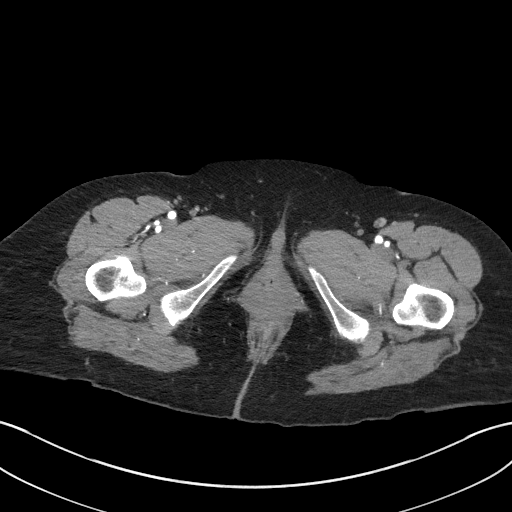
[im 15/204  bone]
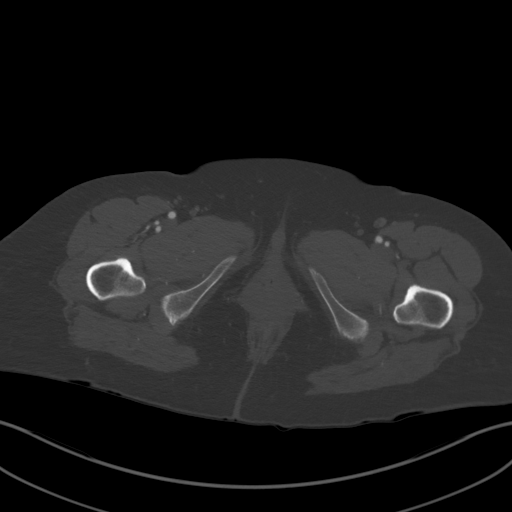
[im 30/204  soft-tissue]
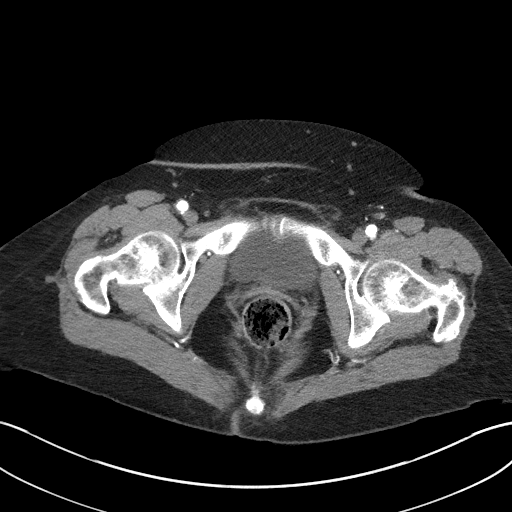
[im 59/204  soft-tissue]
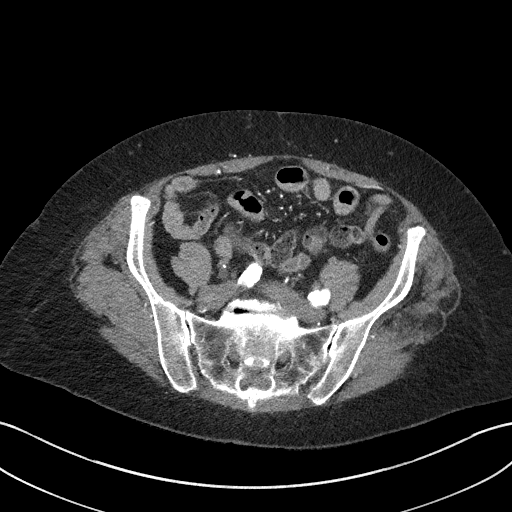
[im 73/204  soft-tissue]
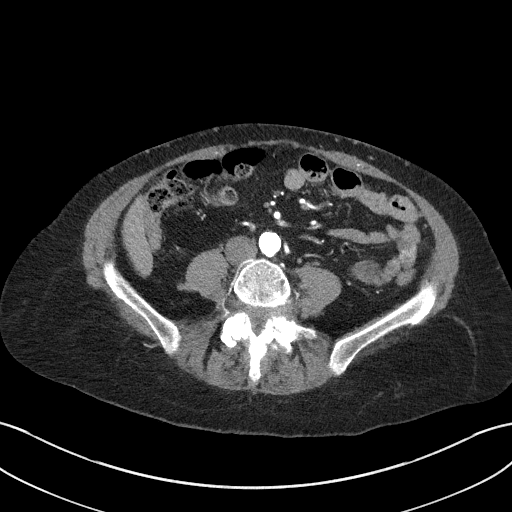
[im 88/204  soft-tissue]
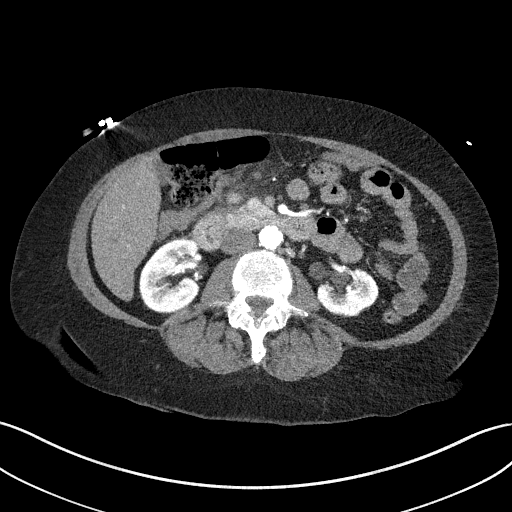
[im 117/204  soft-tissue]
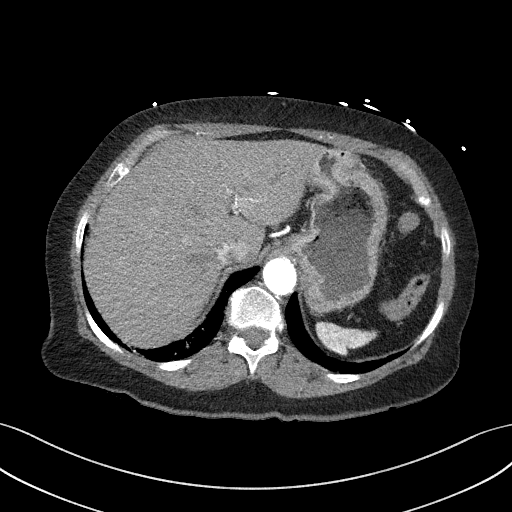
[im 131/204  soft-tissue]
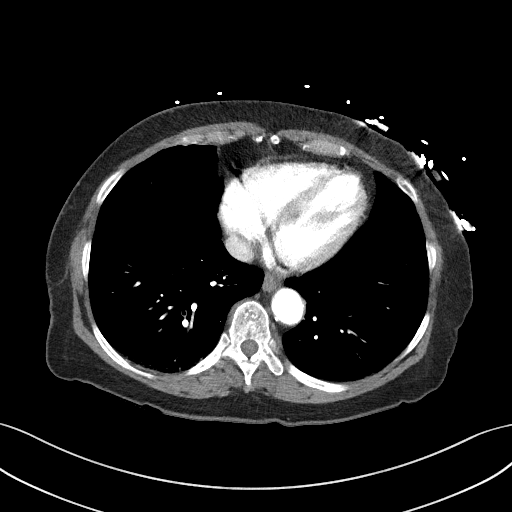
[im 146/204  soft-tissue]
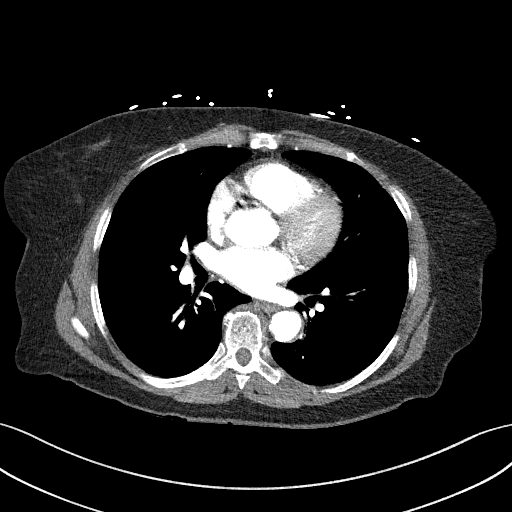
[im 175/204  soft-tissue]
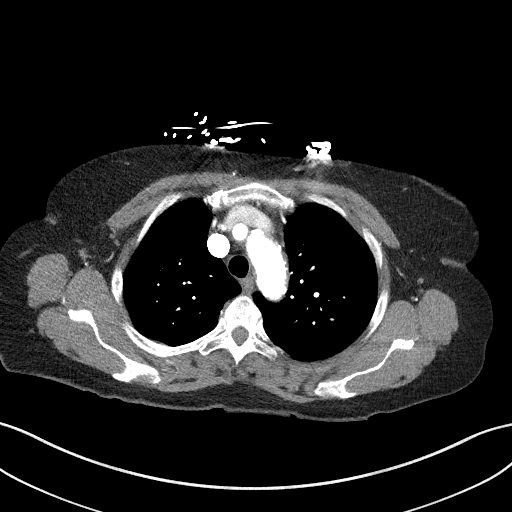
[im 175/204  bone]
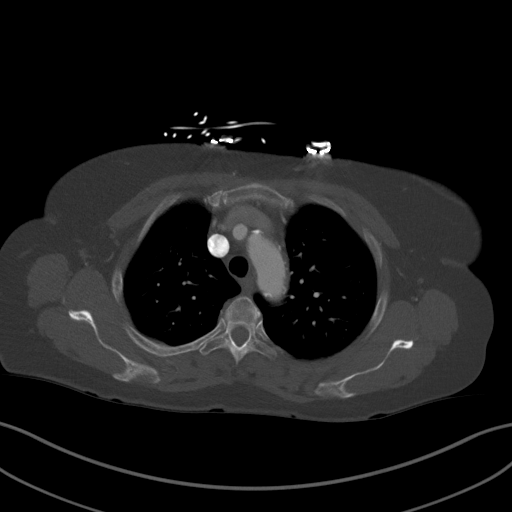
[im 189/204  soft-tissue]
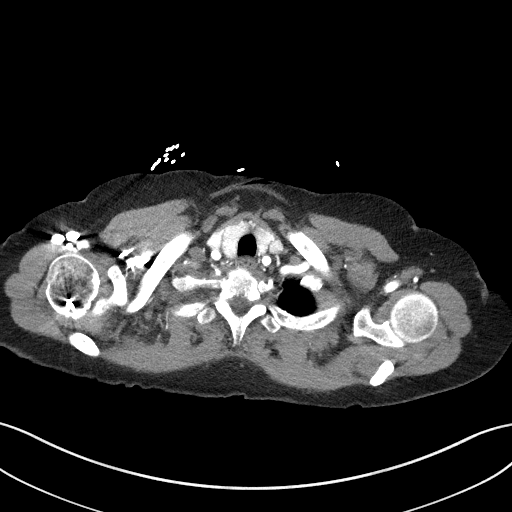

[Series 9: cor soft · coronal · 0.82mm/px · 3 of 152 slices shown]
[im 38/152  soft-tissue]
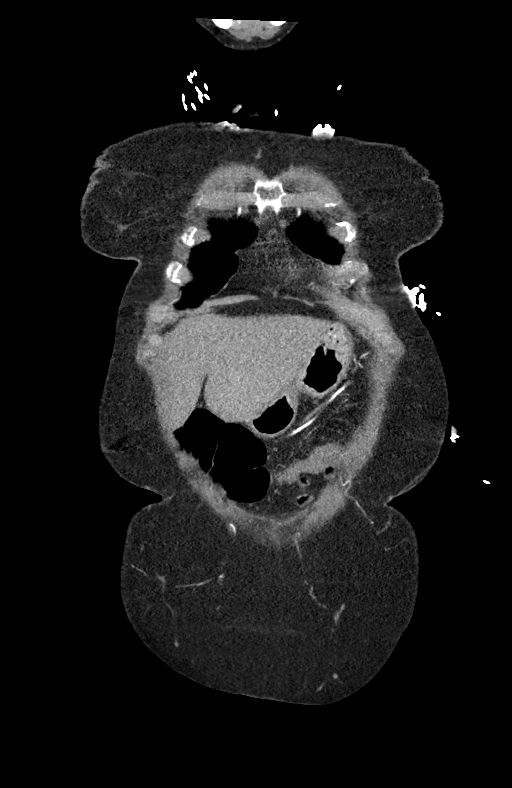
[im 76/152  soft-tissue]
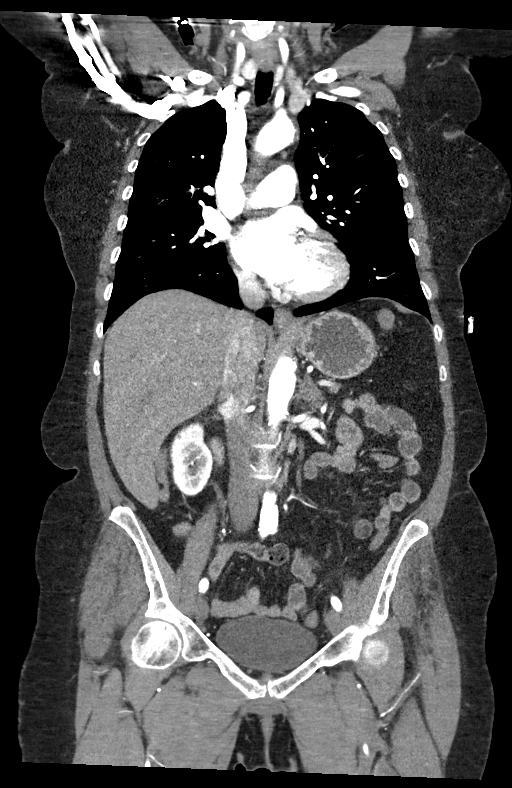
[im 114/152  soft-tissue]
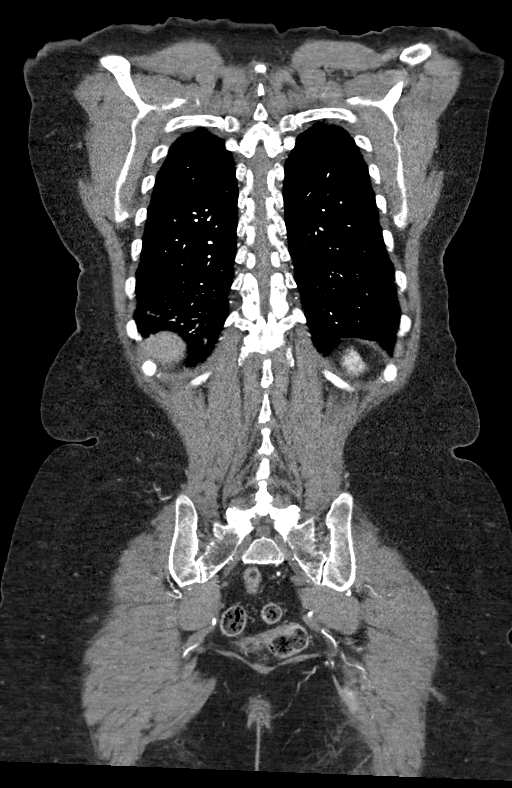

[13 of 46 positions shown; findings below may reference images not displayed]

Multidetector CT imaging through the chest, abdomen and pelvis was
performed using the standard protocol during bolus administration of
intravenous contrast. Multiplanar reconstructed images and MIPs were
obtained and reviewed to evaluate the vascular anatomy.

RADIATION DOSE REDUCTION: This exam was performed according to the
departmental dose-optimization program which includes automated
exposure control, adjustment of the mA and/or kV according to
patient size and/or use of iterative reconstruction technique.

CONTRAST:  100mL OMNIPAQUE IOHEXOL 350 MG/ML SOLN
FINDINGS: CTA CHEST FINDINGS

Cardiovascular: Non contrasted images of the chest demonstrate no
acute intramural hematoma. Aorta is nonaneurysmal. No dissection is
seen. Moderate atherosclerosis. Left-sided arch with normal
three-vessel origin. Normal cardiac size. No pericardial effusion

Mediastinum/Nodes: Midline trachea. No thyroid mass. No suspicious
lymph nodes. Esophagus within normal limits.

Lungs/Pleura: Lungs are clear. No pleural effusion or pneumothorax.

Musculoskeletal: Sternum is intact. No acute osseous abnormality is
seen.

Review of the MIP images confirms the above findings.

CTA ABDOMEN AND PELVIS FINDINGS

VASCULAR

Aorta: Negative for dissection, aneurysm or occlusive disease.
Moderate atherosclerosis.

Celiac: Mild calcification at the origin without significant
stenosis. Negative for aneurysm or occlusion.

SMA: Mild origin calcification. Negative for aneurysm, dissection or
stenosis.

Renals: Single right and single left renal arteries are patent and
without evidence for stenosis, aneurysm, or occlusion.

IMA: Origin calcification. Distal vessel is patent and without
stenosis.

Inflow: Heavily calcified aortoiliac bifurcation. Moderate severe
atherosclerosis of the common iliac vessels. No stenosis or
occlusion. Negative for aneurysm.

Veins: No obvious venous abnormality within the limitations of this
arterial phase study.

Review of the MIP images confirms the above findings.

NON-VASCULAR

Hepatobiliary: No calcified gallstone. No focal hepatic abnormality.
No biliary dilatation

Pancreas: Atrophy of the body and tail of pancreas with ductal
dilatation, progressed compared to prior. Numerous pancreatic
calcifications likely due to chronic pancreatitis. Chunky
calcification at the head of the pancreas measuring 17 mm, slightly
increased in size, appears to be in the region of pancreatic duct.
Indistinct masslike enlargement head of pancreas with mild
stranding. Edema within the pancreaticoduodenal groove.

Spleen: Normal in size without focal abnormality.

Adrenals/Urinary Tract: Thickening of the adrenal glands without
dominant mass. Kidneys show no hydronephrosis. The bladder is normal

Stomach/Bowel: Stomach nonenlarged. Mild wall thickening and edema
at the second portion of duodenum. No extraluminal gas.

Lymphatic: Mild porta hepatis lymph nodes.

Reproductive: Status post hysterectomy. No adnexal masses.

Other: Negative for pelvic effusion or free air.

Musculoskeletal: No acute osseous abnormality

Review of the MIP images confirms the above findings.
IMPRESSION: 1. Negative for acute aortic dissection or aneurysm. Moderate
atherosclerosis of the aorta. No hemodynamically significant
stenosis within the vasculature of the abdomen or pelvis.
2. Masslike enlargement of the pancreatic head with mild stranding
and edema. There is atrophy of the pancreatic body and tail with
ductal dilatation. A large calcification at the head of the pancreas
in the region of the duct may be slightly increased in size.
Uncertain if findings are due to acute on chronic pancreatitis
versus inflammatory pancreatic head mass. When the patient is
clinically stable and able to follow directions and hold their
breath (preferably as an outpatient) further evaluation with
dedicated abdominal MRI should be considered.
3. Wall thickening and inflammation at the second portion of
duodenum suggesting duodenitis versus reactive inflammation from
adjacent pancreatic inflammation.

## 2022-08-26 IMAGING — US US ABDOMEN LIMITED
1 series · 14 of 25 positions shown · non-contrast
Comparison: None.

CLINICAL DATA: Acute pancreatitis

EXAM:
ULTRASOUND ABDOMEN LIMITED RIGHT UPPER QUADRANT

[Series 1: us abdomen limited ruq (liver/gb) · 14 of 77 slices shown]
[im 1/77]
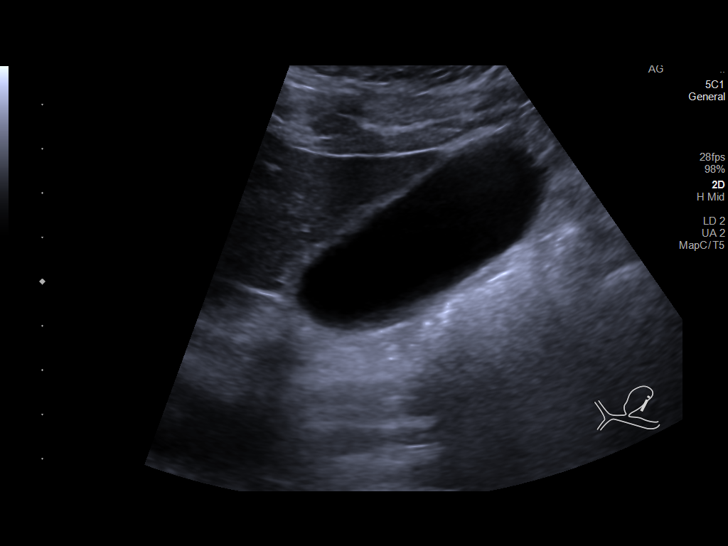
[im 7/77]
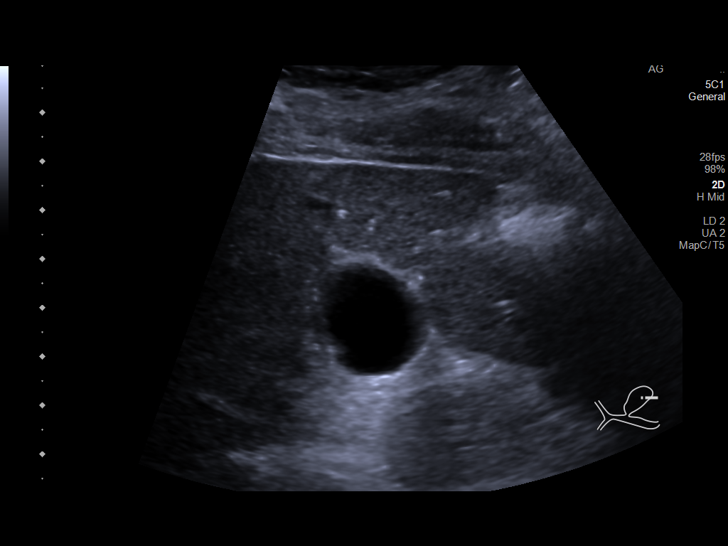
[im 13/77]
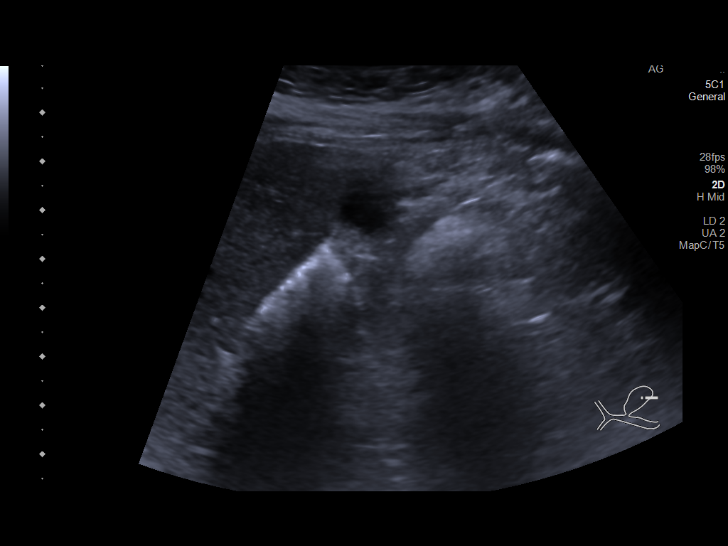
[im 20/77]
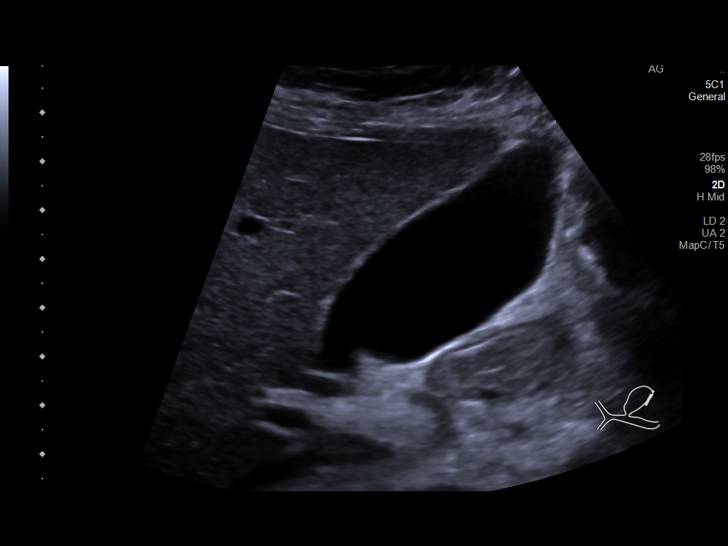
[im 26/77]
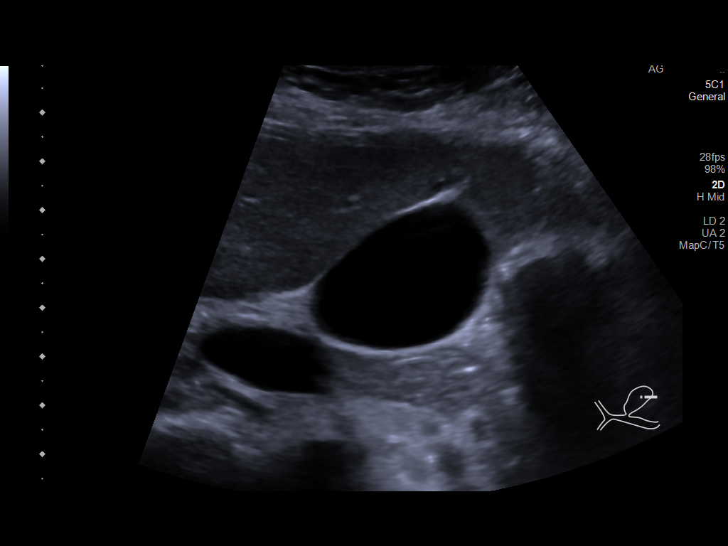
[im 29/77]
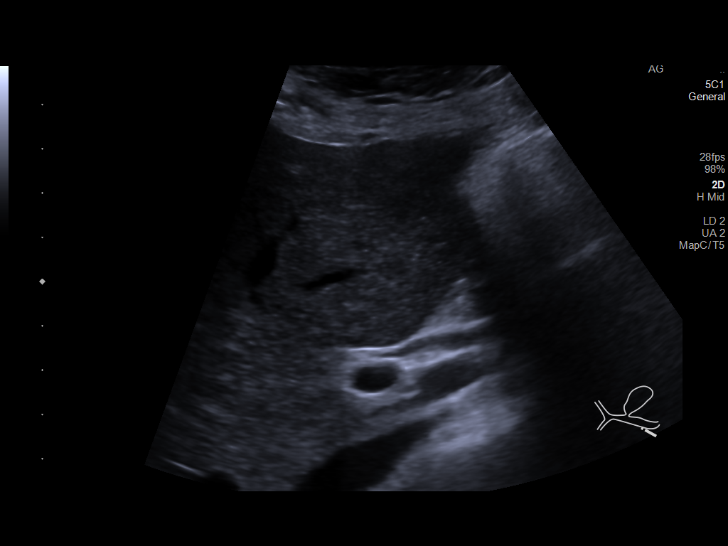
[im 35/77]
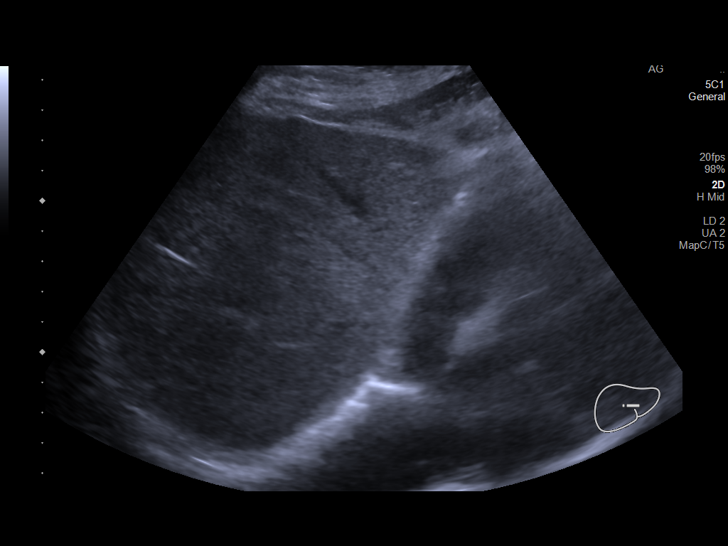
[im 42/77]
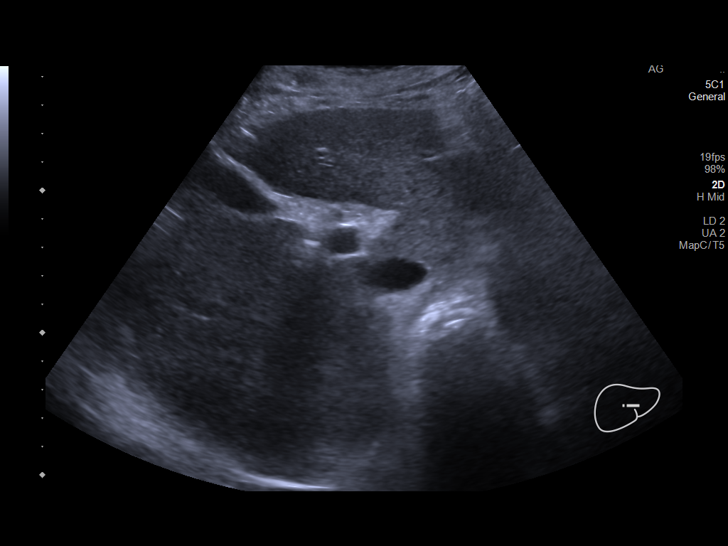
[im 48/77]
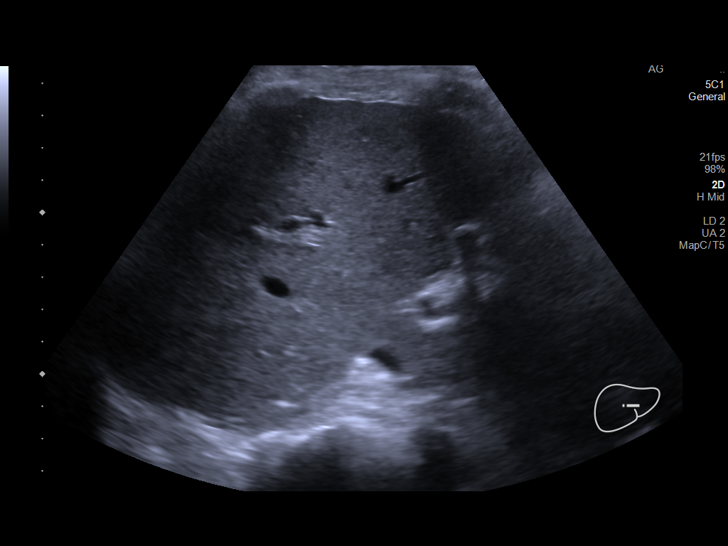
[im 51/77]
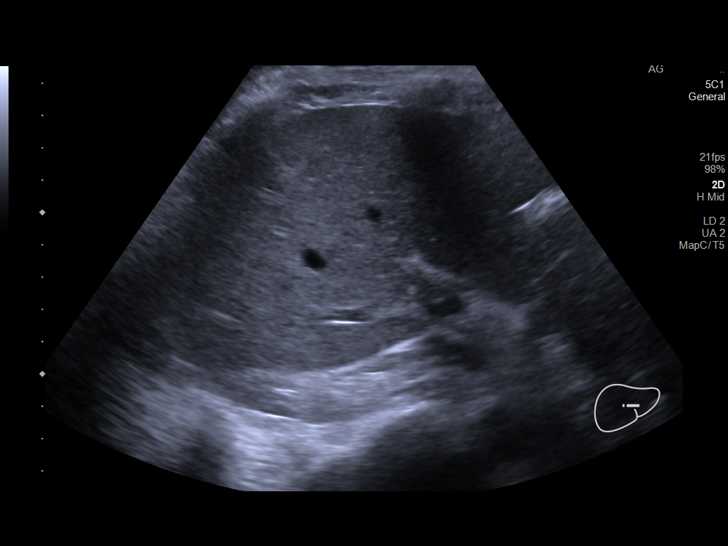
[im 58/77]
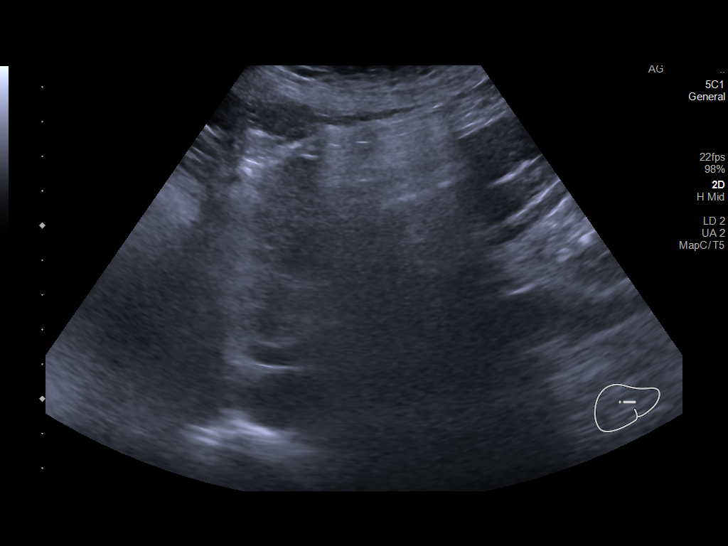
[im 64/77]
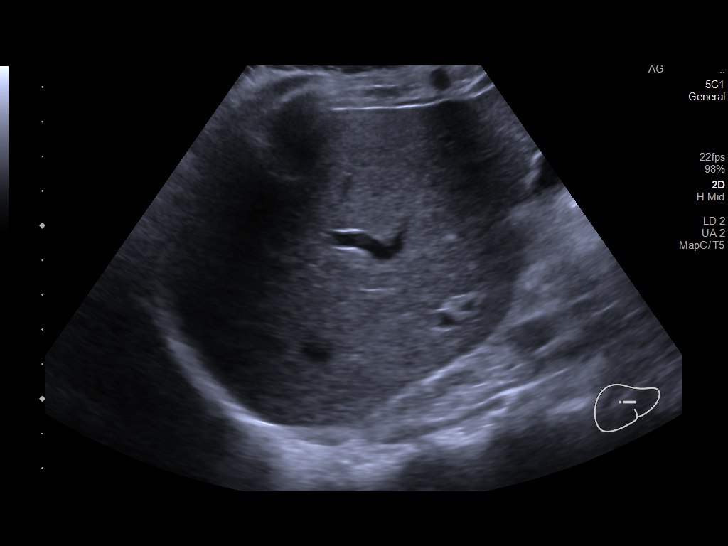
[im 70/77]
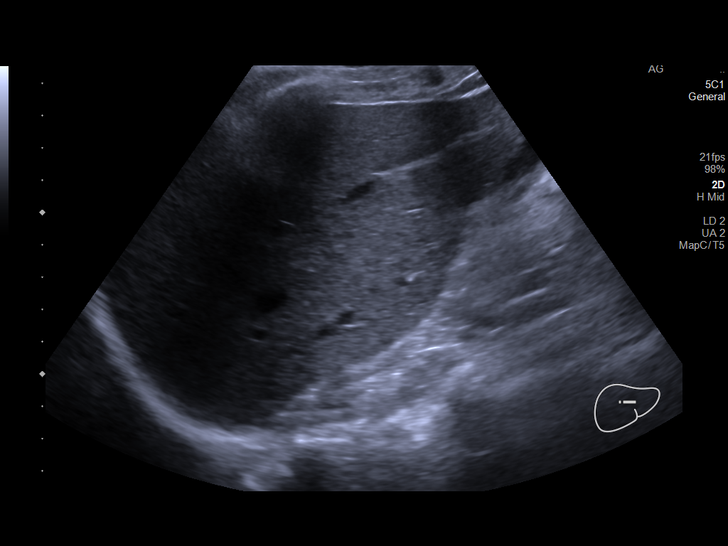
[im 77/77]
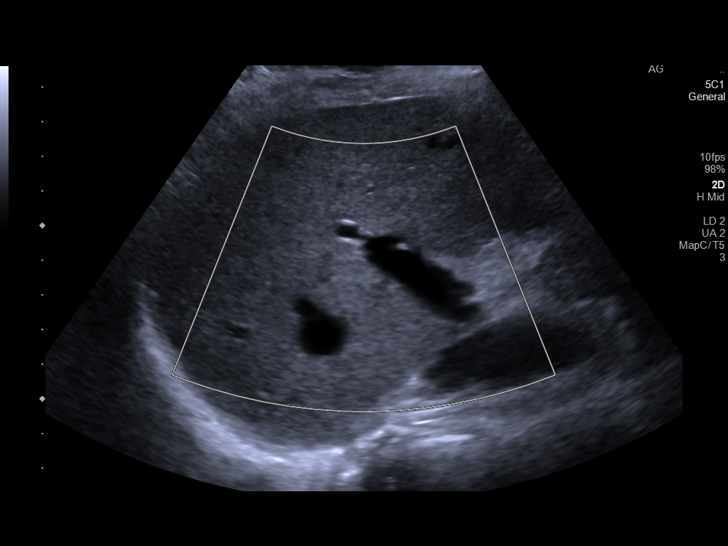

[14 of 25 positions shown; findings below may reference images not displayed]

FINDINGS: Gallbladder:

No gallstones or wall thickening visualized. No sonographic Murphy
sign noted by sonographer.

Common bile duct:

Diameter: 4 mm, normal

Liver:

No focal lesion identified. Within normal limits in parenchymal
echogenicity. Portal vein is patent on color Doppler imaging with
normal direction of blood flow towards the liver.

Other: None.
IMPRESSION: Unremarkable right upper quadrant ultrasound.

## 2022-09-30 DIAGNOSIS — Z23 Encounter for immunization: Secondary | ICD-10-CM | POA: Diagnosis not present

## 2022-11-29 DIAGNOSIS — S335XXA Sprain of ligaments of lumbar spine, initial encounter: Secondary | ICD-10-CM | POA: Diagnosis not present

## 2022-11-29 DIAGNOSIS — E6609 Other obesity due to excess calories: Secondary | ICD-10-CM | POA: Diagnosis not present

## 2022-11-29 DIAGNOSIS — M5136 Other intervertebral disc degeneration, lumbar region: Secondary | ICD-10-CM | POA: Diagnosis not present

## 2022-11-29 DIAGNOSIS — Z6831 Body mass index (BMI) 31.0-31.9, adult: Secondary | ICD-10-CM | POA: Diagnosis not present

## 2023-01-04 ENCOUNTER — Encounter (INDEPENDENT_AMBULATORY_CARE_PROVIDER_SITE_OTHER): Payer: Self-pay | Admitting: *Deleted

## 2023-04-19 DIAGNOSIS — M810 Age-related osteoporosis without current pathological fracture: Secondary | ICD-10-CM | POA: Diagnosis not present

## 2023-04-19 DIAGNOSIS — I10 Essential (primary) hypertension: Secondary | ICD-10-CM | POA: Diagnosis not present

## 2023-04-19 DIAGNOSIS — J449 Chronic obstructive pulmonary disease, unspecified: Secondary | ICD-10-CM | POA: Diagnosis not present

## 2023-10-03 DIAGNOSIS — Z23 Encounter for immunization: Secondary | ICD-10-CM | POA: Diagnosis not present

## 2023-10-20 ENCOUNTER — Other Ambulatory Visit (HOSPITAL_COMMUNITY): Payer: Self-pay | Admitting: Internal Medicine

## 2023-10-20 ENCOUNTER — Encounter (HOSPITAL_COMMUNITY): Payer: Self-pay

## 2023-10-20 ENCOUNTER — Ambulatory Visit (HOSPITAL_COMMUNITY)
Admission: RE | Admit: 2023-10-20 | Discharge: 2023-10-20 | Disposition: A | Discharge status: Home / Self Care | Payer: PPO | Source: Ambulatory Visit | Attending: Internal Medicine | Admitting: Internal Medicine

## 2023-10-20 DIAGNOSIS — Z1231 Encounter for screening mammogram for malignant neoplasm of breast: Secondary | ICD-10-CM

## 2023-12-01 DIAGNOSIS — Z1212 Encounter for screening for malignant neoplasm of rectum: Secondary | ICD-10-CM | POA: Diagnosis not present

## 2023-12-08 DIAGNOSIS — M1991 Primary osteoarthritis, unspecified site: Secondary | ICD-10-CM | POA: Diagnosis not present

## 2023-12-08 DIAGNOSIS — I10 Essential (primary) hypertension: Secondary | ICD-10-CM | POA: Diagnosis not present

## 2023-12-08 DIAGNOSIS — Z6828 Body mass index (BMI) 28.0-28.9, adult: Secondary | ICD-10-CM | POA: Diagnosis not present

## 2023-12-08 DIAGNOSIS — M5134 Other intervertebral disc degeneration, thoracic region: Secondary | ICD-10-CM | POA: Diagnosis not present

## 2023-12-08 DIAGNOSIS — M810 Age-related osteoporosis without current pathological fracture: Secondary | ICD-10-CM | POA: Diagnosis not present

## 2023-12-08 DIAGNOSIS — J449 Chronic obstructive pulmonary disease, unspecified: Secondary | ICD-10-CM | POA: Diagnosis not present

## 2023-12-08 DIAGNOSIS — Z0001 Encounter for general adult medical examination with abnormal findings: Secondary | ICD-10-CM | POA: Diagnosis not present

## 2023-12-08 DIAGNOSIS — Z1331 Encounter for screening for depression: Secondary | ICD-10-CM | POA: Diagnosis not present

## 2024-01-25 DIAGNOSIS — Z23 Encounter for immunization: Secondary | ICD-10-CM | POA: Diagnosis not present

## 2024-02-18 ENCOUNTER — Other Ambulatory Visit: Payer: Self-pay

## 2024-02-18 ENCOUNTER — Emergency Department (HOSPITAL_COMMUNITY)
Admission: EM | Admit: 2024-02-18 | Discharge: 2024-02-18 | Disposition: A | Discharge status: Home / Self Care | Attending: Emergency Medicine | Admitting: Emergency Medicine

## 2024-02-18 ENCOUNTER — Emergency Department (HOSPITAL_COMMUNITY)

## 2024-02-18 ENCOUNTER — Encounter (HOSPITAL_COMMUNITY): Payer: Self-pay | Admitting: *Deleted

## 2024-02-18 DIAGNOSIS — R918 Other nonspecific abnormal finding of lung field: Secondary | ICD-10-CM | POA: Diagnosis not present

## 2024-02-18 DIAGNOSIS — R1013 Epigastric pain: Secondary | ICD-10-CM | POA: Insufficient documentation

## 2024-02-18 DIAGNOSIS — R110 Nausea: Secondary | ICD-10-CM | POA: Insufficient documentation

## 2024-02-18 DIAGNOSIS — K859 Acute pancreatitis without necrosis or infection, unspecified: Secondary | ICD-10-CM | POA: Diagnosis not present

## 2024-02-18 DIAGNOSIS — K8689 Other specified diseases of pancreas: Secondary | ICD-10-CM | POA: Diagnosis not present

## 2024-02-18 DIAGNOSIS — R748 Abnormal levels of other serum enzymes: Secondary | ICD-10-CM | POA: Diagnosis not present

## 2024-02-18 DIAGNOSIS — Z79899 Other long term (current) drug therapy: Secondary | ICD-10-CM | POA: Diagnosis not present

## 2024-02-18 LAB — COMPREHENSIVE METABOLIC PANEL
ALT: 13 U/L (ref 0–44)
AST: 17 U/L (ref 15–41)
Albumin: 3.9 g/dL (ref 3.5–5.0)
Alkaline Phosphatase: 73 U/L (ref 38–126)
Anion gap: 10 (ref 5–15)
BUN: 19 mg/dL (ref 8–23)
CO2: 23 mmol/L (ref 22–32)
Calcium: 9.4 mg/dL (ref 8.9–10.3)
Chloride: 106 mmol/L (ref 98–111)
Creatinine, Ser: 0.96 mg/dL (ref 0.44–1.00)
GFR, Estimated: >60 mL/min (ref >60)
Glucose, Bld: 94 mg/dL (ref 70–99)
Potassium: 3.6 mmol/L (ref 3.5–5.1)
Sodium: 139 mmol/L (ref 135–145)
Total Bilirubin: 0.6 mg/dL (ref 0.0–1.2)
Total Protein: 7.4 g/dL (ref 6.5–8.1)

## 2024-02-18 LAB — CBC WITH DIFFERENTIAL/PLATELET
Abs Immature Granulocytes: 0.03 10*3/uL (ref 0.00–0.07)
Basophils Absolute: 0.1 10*3/uL (ref 0.0–0.1)
Basophils Relative: 1 %
Eosinophils Absolute: 0.3 10*3/uL (ref 0.0–0.5)
Eosinophils Relative: 3 %
HCT: 44.5 % (ref 36.0–46.0)
Hemoglobin: 14.7 g/dL (ref 12.0–15.0)
Immature Granulocytes: 0 %
Lymphocytes Relative: 26 %
Lymphs Abs: 2.5 10*3/uL (ref 0.7–4.0)
MCH: 30.4 pg (ref 26.0–34.0)
MCHC: 33 g/dL (ref 30.0–36.0)
MCV: 91.9 fL (ref 80.0–100.0)
Monocytes Absolute: 0.6 10*3/uL (ref 0.1–1.0)
Monocytes Relative: 6 %
Neutro Abs: 6 10*3/uL (ref 1.7–7.7)
Neutrophils Relative %: 64 %
Platelets: 329 10*3/uL (ref 150–400)
RBC: 4.84 MIL/uL (ref 3.87–5.11)
RDW: 14.2 % (ref 11.5–15.5)
WBC: 9.5 10*3/uL (ref 4.0–10.5)
nRBC: 0 % (ref 0.0–0.2)

## 2024-02-18 LAB — URINALYSIS, ROUTINE W REFLEX MICROSCOPIC
Bilirubin Urine: NEGATIVE
Glucose, UA: NEGATIVE mg/dL
Hgb urine dipstick: NEGATIVE
Ketones, ur: NEGATIVE mg/dL
Leukocytes,Ua: NEGATIVE
Nitrite: NEGATIVE
Protein, ur: NEGATIVE mg/dL
Specific Gravity, Urine: 1.031 — ABNORMAL HIGH (ref 1.005–1.030)
pH: 7 (ref 5.0–8.0)

## 2024-02-18 LAB — LACTIC ACID, PLASMA: Lactic Acid, Venous: 0.9 mmol/L (ref 0.5–1.9)

## 2024-02-18 LAB — LIPASE, BLOOD: Lipase: 283 U/L — ABNORMAL HIGH (ref 11–51)

## 2024-02-18 LAB — TROPONIN I (HIGH SENSITIVITY): Troponin I (High Sensitivity): 17 ng/L (ref <18)

## 2024-02-18 MED ORDER — OXYCODONE-ACETAMINOPHEN 5-325 MG PO TABS: 1.0000 | ORAL_TABLET | Freq: Four times a day (QID) | ORAL | 0 refills | Status: AC | PRN

## 2024-02-18 MED ORDER — ONDANSETRON 4 MG PO TBDP: 4.0000 mg | ORAL_TABLET | Freq: Three times a day (TID) | ORAL | 0 refills | Status: AC | PRN

## 2024-02-18 MED ORDER — MORPHINE SULFATE (PF) 4 MG/ML IV SOLN
4.0000 mg | Freq: Once | INTRAVENOUS | Status: AC
  Administered 2024-02-18: 4 mg via INTRAVENOUS
  Filled 2024-02-18: qty 1

## 2024-02-18 MED ORDER — OXYCODONE-ACETAMINOPHEN 5-325 MG PO TABS
1.0000 | ORAL_TABLET | Freq: Once | ORAL | Status: AC
  Administered 2024-02-18: 1 via ORAL
  Filled 2024-02-18: qty 1

## 2024-02-18 MED ORDER — ONDANSETRON HCL 4 MG/2ML IJ SOLN
4.0000 mg | Freq: Once | INTRAMUSCULAR | Status: AC
  Administered 2024-02-18: 4 mg via INTRAVENOUS
  Filled 2024-02-18: qty 2

## 2024-02-18 MED ORDER — SODIUM CHLORIDE 0.9 % IV BOLUS
500.0000 mL | Freq: Once | INTRAVENOUS | Status: AC
  Administered 2024-02-18: 500 mL via INTRAVENOUS

## 2024-02-18 MED ORDER — OMEPRAZOLE 20 MG PO CPDR
20.0000 mg | DELAYED_RELEASE_CAPSULE | Freq: Every day | ORAL | 0 refills | Status: AC
Start: 2024-02-18 — End: ?

## 2024-02-18 MED ORDER — IOHEXOL 300 MG/ML  SOLN
100.0000 mL | Freq: Once | INTRAMUSCULAR | Status: AC | PRN
  Administered 2024-02-18: 100 mL via INTRAVENOUS

## 2024-02-18 NOTE — ED Provider Notes (Signed)
 [facility] DEPARTMENT AT [facility] Provider Note   CSN: [id number] Arrival date & time: 02/18/24  1502     History  Chief Complaint  Patient presents with   Abdominal Pain    Elizabeth [name] is a 68 y.o. female.  Patient is a 68 year old female who presents to the emergency department the chief complaint of epigastric abdominal pain which has been ongoing for approximate the past 3 days.  Patient notes that the pain began after coughing.  She notes that she has had some associated nausea without vomiting.  Patient notes that she has had no fever, chills, chest pain or shortness of breath.  She denies any associated diarrhea or constipation.  There has been no associated dizziness, lightheadedness or syncope.  She has had no recent falls or blunt abdominal wall trauma.   Abdominal Pain      Home Medications Prior to Admission medications   Medication Sig Start Date End Date Taking? Authorizing Provider  albuterol (VENTOLIN HFA) 108 (90 Base) MCG/ACT inhaler Inhale 2 puffs into the lungs every 6 (six) hours as needed for shortness of breath or wheezing. 10/30/21   [provider]  amLODipine (NORVASC) 5 MG tablet Take 1 tablet (5 mg total) by mouth daily. 02/13/22   Catarina Hartshorn, MD  Cholecalciferol (VITAMIN D3 PO) Take 1 tablet by mouth daily.    [provider]  guaiFENesin (MUCINEX) 600 MG 12 hr tablet Take 600 mg by mouth 2 (two) times daily. Patient not taking: Reported on 03/25/2022    [provider]      Allergies    Codeine    Review of Systems   Review of Systems  Gastrointestinal:  Positive for abdominal pain.  All other systems reviewed and are negative.   Physical Exam Updated Vital Signs BP 132/75   Pulse 70   Temp 99.3 F (37.4 C) (Oral)   Resp 17   Ht 4\' 11"  (1.499 m)   Wt 65.8 kg   SpO2 96%   BMI 29.29 kg/m  Physical Exam Vitals and nursing note reviewed.  Constitutional:      Appearance: Normal  appearance.  HENT:     Head: Normocephalic and atraumatic.     Nose: Nose normal.     Mouth/Throat:     Mouth: Mucous membranes are moist.  Eyes:     Extraocular Movements: Extraocular movements intact.     Conjunctiva/sclera: Conjunctivae normal.     Pupils: Pupils are equal, round, and reactive to light.  Cardiovascular:     Rate and Rhythm: Normal rate and regular rhythm.     Pulses: Normal pulses.     Heart sounds: Normal heart sounds.  Pulmonary:     Effort: Pulmonary effort is normal.     Breath sounds: Normal breath sounds.  Abdominal:     General: Abdomen is flat. Bowel sounds are normal. There is no distension.     Palpations: Abdomen is soft.     Tenderness: There is abdominal tenderness in the epigastric area.  Musculoskeletal:        General: Normal range of motion.     Cervical back: Normal range of motion and neck supple.  Skin:    General: Skin is warm and dry.  Neurological:     General: No focal deficit present.     Mental Status: She is alert and oriented to person, place, and time. Mental status is at baseline.  Psychiatric:  Mood and Affect: Mood normal.        Behavior: Behavior normal.        Thought Content: Thought content normal.        Judgment: Judgment normal.     ED Results / Procedures / Treatments   Labs (all labs ordered are listed, but only abnormal results are displayed) Labs Reviewed  URINALYSIS, ROUTINE W REFLEX MICROSCOPIC - Abnormal; Notable for the following components:      Result Value   Color, Urine STRAW (*)    Specific Gravity, Urine 1.031 (*)    All other components within normal limits  LIPASE, BLOOD - Abnormal; Notable for the following components:   Lipase 283 (*)    All other components within normal limits  COMPREHENSIVE METABOLIC PANEL  LACTIC ACID, PLASMA  CBC WITH DIFFERENTIAL/PLATELET  TROPONIN I (HIGH SENSITIVITY)    EKG None  Radiology CT ABDOMEN PELVIS W CONTRAST Result Date: 02/18/2024 CLINICAL  DATA:  Epigastric pain. EXAM: CT ABDOMEN AND PELVIS WITH CONTRAST TECHNIQUE: Multidetector CT imaging of the abdomen and pelvis was performed using the standard protocol following bolus administration of intravenous contrast. RADIATION DOSE REDUCTION: This exam was performed according to the departmental dose-optimization program which includes automated exposure control, adjustment of the mA and/or kV according to patient size and/or use of iterative reconstruction technique. CONTRAST:  OMNIPAQUE IOHEXOL 300 MG/ML  SOLN COMPARISON:  CT 02/10/2022 FINDINGS: Lower chest: Minimal hypoventilatory atelectasis. No pleural fluid. There are coronary artery calcifications. Hepatobiliary: No focal liver abnormality is seen. No gallstones, gallbladder wall thickening, or biliary dilatation. Pancreas: Pancreatic parenchymal atrophy and calcifications. There is faint soft tissue edema adjacent to the pancreatic head series 2, image 30. No acute peripancreatic collection or evidence of necrosis. Spleen: Normal in size without focal abnormality. Adrenals/Urinary Tract: Adrenal thickening without suspicious nodule, similar to prior. Stable extrarenal pelvis configuration of the kidneys. No renal calculi or suspicious renal lesion. Unremarkable urinary bladder. Stomach/Bowel: No abnormal gastric distension. No bowel wall thickening or inflammatory change. Small volume of stool in the colon. Vascular/Lymphatic: Advanced aortic atherosclerosis. No aneurysm. The portal and splenic veins are patent. No acute vascular findings no abdominopelvic adenopathy. Reproductive: Status post hysterectomy. No adnexal masses. Other: No free air or ascites.  No abdominal wall hernia. Musculoskeletal: Chronic grade 1 anterolisthesis of L4 on L5 due to prominent facet hypertrophy. There are no acute or suspicious osseous abnormalities. IMPRESSION: 1. Faint soft tissue edema adjacent to the pancreatic head, suspicious for acute on chronic  pancreatitis. No acute peripancreatic collection or evidence of necrosis. 2.  Aortic Atherosclerosis (ICD10-I70.0). Electronically Signed   By: Narda Rutherford M.D.   On: 02/18/2024 18:08   DG Chest Port 1 View Result Date: 02/18/2024 CLINICAL DATA:  epigastric pain EXAM: PORTABLE CHEST - 1 VIEW COMPARISON:  10/17/2017 FINDINGS: Cardiomediastinal silhouette and pulmonary vasculature are within normal limits. No focal airspace opacity to indicate pneumonia. There is a 8 mm nodular opacity in the left mid lung projecting over the anterior segment of the left third rib. IMPRESSION: 1. No acute cardiopulmonary process. 2. 8 mm nodular opacity projecting over the anterior segment of the left third rib. Further evaluation with contrast-enhanced chest CT should be performed to exclude underlying lung lesion. Electronically Signed   By: Acquanetta Belling M.D.   On: 02/18/2024 16:57    Procedures Procedures    Medications Ordered in ED Medications  oxyCODONE-acetaminophen (PERCOCET/ROXICET) 5-325 MG per tablet 1 tablet (has no administration in time range)  morphine (PF) 4 MG/ML injection 4 mg (4 mg Intravenous Given 02/18/24 1559)  ondansetron (ZOFRAN) injection 4 mg (4 mg Intravenous Given 02/18/24 1559)  sodium chloride 0.9 % bolus 500 mL (0 mLs Intravenous Stopped 02/18/24 1742)  iohexol (OMNIPAQUE) 300 MG/ML solution 100 mL (100 mLs Intravenous Contrast Given 02/18/24 1641)    ED Course/ Medical Decision Making/ A&P                                 Medical Decision Making Amount and/or Complexity of Data Reviewed Labs: ordered. Radiology: ordered.  Risk Prescription drug management.   This patient presents to the ED for concern of epigastric pain differential diagnosis includes acute appendicitis, cholecystitis, bowel torsion, diverticulitis, ovarian torsion or cyst, PID, tumor and abscess, pyelonephritis, kidney stone, pancreatitis, mesenteric ischemia, ACS    Additional history  obtained:  Additional history obtained from medical records External records from outside source obtained and reviewed including none   Lab Tests:  I Ordered, and personally interpreted labs.  The pertinent results include: Elevated lipase, normal liver function and bilirubin   Imaging Studies ordered:  I ordered imaging studies including CT scan of the abdomen and pelvis I independently visualized and interpreted imaging which showed peripancreatic fat stranding I agree with the radiologist interpretation   Medicines ordered and prescription drug management:  I ordered medication including Percocet, morphine, IV fluids, Zofran for acute pancreatitis reevaluation of the patient after these medicines showed that the patient improved I have reviewed the patients home medicines and have made adjustments as needed   Problem List / ED Course:  Patient is doing well at this time and is feeling much better after treatment in the emergency department.  Discussed with patient that she does have acute pancreatitis at this time.  Discussed with patient about possible admission at this point for continued symptom control but patient does state that given the improvement in her pain she would like to try outpatient management.  Do suspect that this is reasonable at this time given the mild nature of her findings on CT scan.  She had no other acute surgical pathology on her CT scan.  She has no indication for fluid overload and no pulmonary symptoms.  Patient has no changes in liver function or bilirubin.  There is no indication for acute cholecystitis, choledocholithiasis, acute cholangitis.  Will prescribe patient pain medication on an outpatient basis and long discussion was had regarding diet going forth to help with her symptoms.  Close follow-up with her primary care doctor and gastroenterology was discussed as well as strict turn precautions for any new or worsening symptoms.  Patient voiced  understanding to the plan and had no additional questions.   Social Determinants of Health:  None           Final Clinical Impression(s) / ED Diagnoses Final diagnoses:  None    Rx / DC Orders ED Discharge Orders     None         Kathlen Mody 02/18/24 1847    Gloris Manchester, MD 02/18/24 2355

## 2024-02-18 NOTE — ED Triage Notes (Signed)
 Pt with abd pain that radiates to back x 2-3 days.  Denies cough. Denies any N/v/D.

## 2024-02-18 NOTE — Discharge Instructions (Signed)
 He is continue to adhere to a clear liquid diet over the next few days and slowly progress your diet as the pain resolves.  Please continue good oral hydration at home.  Take the pain medication as needed.  Follow-up closely with your primary care doctor and gastroenterology on an outpatient basis.  Return to emergency department immediately for any new or worsening symptoms to include worsening pain, fevers, shortness of breath, nausea and vomiting.

## 2024-03-21 ENCOUNTER — Ambulatory Visit
Admission: EM | Admit: 2024-03-21 | Discharge: 2024-03-21 | Disposition: A | Discharge status: Home / Self Care | Attending: Nurse Practitioner | Admitting: Nurse Practitioner

## 2024-03-21 DIAGNOSIS — F17200 Nicotine dependence, unspecified, uncomplicated: Secondary | ICD-10-CM | POA: Diagnosis not present

## 2024-03-21 DIAGNOSIS — J441 Chronic obstructive pulmonary disease with (acute) exacerbation: Secondary | ICD-10-CM | POA: Diagnosis not present

## 2024-03-21 DIAGNOSIS — Z20828 Contact with and (suspected) exposure to other viral communicable diseases: Secondary | ICD-10-CM | POA: Diagnosis not present

## 2024-03-21 DIAGNOSIS — E663 Overweight: Secondary | ICD-10-CM | POA: Diagnosis not present

## 2024-03-21 DIAGNOSIS — M5432 Sciatica, left side: Secondary | ICD-10-CM | POA: Diagnosis not present

## 2024-03-21 DIAGNOSIS — Z8739 Personal history of other diseases of the musculoskeletal system and connective tissue: Secondary | ICD-10-CM | POA: Diagnosis not present

## 2024-03-21 DIAGNOSIS — Z6829 Body mass index (BMI) 29.0-29.9, adult: Secondary | ICD-10-CM | POA: Diagnosis not present

## 2024-03-21 DIAGNOSIS — R6889 Other general symptoms and signs: Secondary | ICD-10-CM | POA: Diagnosis not present

## 2024-03-21 DIAGNOSIS — M25552 Pain in left hip: Secondary | ICD-10-CM | POA: Diagnosis not present

## 2024-03-21 MED ORDER — TIZANIDINE HCL 4 MG PO TABS: 4.0000 mg | ORAL_TABLET | Freq: Three times a day (TID) | ORAL | 0 refills | Status: AC | PRN

## 2024-03-21 NOTE — Discharge Instructions (Addendum)
 Take medication as prescribed. Try to remain as active as possible. Gentle range of motion and stretching exercises to help with pain and stiffness. May apply ice or heat as needed.  Ice is recommended for pain or swelling, heat for spasm or stiffness.  Apply for 20 minutes, remove for 1 hour, then repeat. May take over-the-counter Tylenol Extra Strength 500 mg tablet every 8 hours as needed for pain. Go to the emergency department immediately if you develop weakness in your legs or feet, inability to walk, loss of bowel or bladder function, difficulty urinating or passing a bowel movement, or other concerns. Follow-up with your primary care physician if your symptoms do not improve.  Follow-up as needed.

## 2024-03-21 NOTE — ED Provider Notes (Signed)
 [facility]    CSN: [id number] Arrival date & time: 03/21/24  1644      History   Chief Complaint No chief complaint on file.   HPI Elizabeth [name] is a 68 y.o. female.   The history is provided by the patient.   Patient presents for complaints worsening left hip pain.  Patient states she was seen by her PCP earlier today for the same symptoms.  States she was given 2 shots and was told that she may have sciatica.  Patient was also prescribed prednisone.  Patient states since the appointment earlier today, she has had worsening pain in the left hip.  Patient is unsure of the medications that were administered at her PCPs office.  States that she has already taking the prednisone that was prescribed earlier.  She denies injury, trauma, numbness, or tingling.  Patient states that the pain from the left hip radiates down the left thigh.  She has not contacted her doctor regarding the worsening pain. Past Medical History:  Diagnosis Date   Arthritis    Back pain    Hypertension    Hypothyroidism    Pancreatitis due to common bile duct stone 11/2012    Patient Active Problem List   Diagnosis Date Noted   Tobacco abuse 02/11/2022   Acute on chronic pancreatitis (HCC) 02/10/2022   Hypokalemia 02/10/2022   Prolonged QT interval 02/10/2022   Rectus sheath hematoma, initial encounter 10/18/2017   Leukocytosis 10/18/2017   Hypothyroidism 10/18/2017   Reactive airway disease 10/18/2017   Bronchitis due to tobacco use 10/18/2017   COPD (chronic obstructive pulmonary disease) (HCC)    Varicose veins of bilateral lower extremities with other complications 09/07/2013   Pancreatitis, acute 12/04/2012   HTN (hypertension) 12/04/2012    Past Surgical History:  Procedure Laterality Date   ABDOMINAL HYSTERECTOMY     APPENDECTOMY     COLONOSCOPY N/A 01/31/2013   Procedure: COLONOSCOPY;  Surgeon: Malissa Hippo, MD;  Location: [facility];  Service: Endoscopy;  Laterality: N/A;   100   ERCP     HERNIA REPAIR     Right rotator cuff repair     TUBAL LIGATION      OB History   No obstetric history on file.      Home Medications    Prior to Admission medications   Medication Sig Start Date End Date Taking? Authorizing Provider  albuterol (VENTOLIN HFA) 108 (90 Base) MCG/ACT inhaler Inhale 2 puffs into the lungs every 6 (six) hours as needed for shortness of breath or wheezing. 10/30/21   [provider]  amLODipine (NORVASC) 5 MG tablet Take 1 tablet (5 mg total) by mouth daily. 02/13/22   Catarina Hartshorn, MD  Cholecalciferol (VITAMIN D3 PO) Take 1 tablet by mouth daily.    [provider]  guaiFENesin (MUCINEX) 600 MG 12 hr tablet Take 600 mg by mouth 2 (two) times daily. Patient not taking: Reported on 03/25/2022    [provider]  omeprazole (PRILOSEC) 20 MG capsule Take 1 capsule (20 mg total) by mouth daily. 02/18/24   Huston Foley D, PA-C  ondansetron (ZOFRAN-ODT) 4 MG disintegrating tablet Take 1 tablet (4 mg total) by mouth every 8 (eight) hours as needed for nausea or vomiting. 02/18/24   Lelon Perla, PA-C  oxyCODONE-acetaminophen (PERCOCET/ROXICET) 5-325 MG tablet Take 1 tablet by mouth every 6 (six) hours as needed for severe pain (pain score 7-10). 02/18/24   Lelon Perla, PA-C  Family History Family History  Problem Relation Age of Onset   Ovarian cancer Mother    COPD Father    Diabetes Brother    Liver cancer Brother    Hypertension Sister    Mental illness Sister    Hypertension Son    Heart attack Maternal Grandfather    Other Maternal Grandfather        had a trach   Colon cancer Neg Hx     Social History Social History   Tobacco Use   Smoking status: Every Day    Current packs/day: 1.00    Average packs/day: 1 pack/day for 41.0 years (41.0 ttl pk-yrs)    Types: Cigarettes   Smokeless tobacco: Never  Substance Use Topics   Alcohol use: Yes    Comment: rarely   Drug use: No      Allergies   Codeine   Review of Systems Review of Systems Per HPI  Physical Exam Triage Vital Signs ED Triage Vitals  Encounter Vitals Group     BP 03/21/24 1655 (!) 149/76     Systolic BP Percentile --      Diastolic BP Percentile --      Pulse Rate 03/21/24 1655 (!) 107     Resp 03/21/24 1655 20     Temp 03/21/24 1655 98.2 F (36.8 C)     Temp Source 03/21/24 1655 Oral     SpO2 03/21/24 1655 98 %     Weight --      Height --      Head Circumference --      Peak Flow --      Pain Score 03/21/24 1700 10     Pain Loc --      Pain Education --      Exclude from Growth Chart --    No data found.  Updated Vital Signs BP (!) 149/76 (BP Location: Right Arm)   Pulse (!) 107   Temp 98.2 F (36.8 C) (Oral)   Resp 20   SpO2 98%   Visual Acuity Right Eye Distance:   Left Eye Distance:   Bilateral Distance:    Right Eye Near:   Left Eye Near:    Bilateral Near:     Physical Exam Vitals and nursing note reviewed.  Constitutional:      General: She is not in acute distress.    Appearance: Normal appearance.  HENT:     Head: Normocephalic.  Eyes:     Extraocular Movements: Extraocular movements intact.     Pupils: Pupils are equal, round, and reactive to light.  Cardiovascular:     Rate and Rhythm: Normal rate and regular rhythm.     Pulses: Normal pulses.     Heart sounds: Normal heart sounds.  Pulmonary:     Effort: Pulmonary effort is normal. No respiratory distress.     Breath sounds: Normal breath sounds. No stridor. No wheezing, rhonchi or rales.  Chest:     Chest wall: No tenderness.  Musculoskeletal:     Cervical back: Normal range of motion.       Legs:  Skin:    General: Skin is warm and dry.  Neurological:     General: No focal deficit present.     Mental Status: She is alert and oriented to person, place, and time.  Psychiatric:        Mood and Affect: Mood normal.        Behavior: Behavior normal.  UC Treatments / Results   Labs (all labs ordered are listed, but only abnormal results are displayed) Labs Reviewed - No data to display  EKG   Radiology No results found.  Procedures Procedures (including critical care time)  Medications Ordered in UC Medications - No data to display  Initial Impression / Assessment and Plan / UC Course  I have reviewed the triage vital signs and the nursing notes.  Pertinent labs & imaging results that were available during my care of the patient were reviewed by me and considered in my medical decision making (see chart for details).  Patient with worsening hip pain after seeing her PCP earlier today.  Symptoms are consistent with sciatica.  Patient was treated with tizanidine  *** Final Clinical Impressions(s) / UC Diagnoses   Final diagnoses:  None   Discharge Instructions   None    ED Prescriptions   None    PDMP not reviewed this encounter.

## 2024-03-21 NOTE — ED Triage Notes (Signed)
 Pt reports left leg pain pt states being seen this morning at her primary care at [place], was given two shots, told she may have sciatica. Pt states she was also given prednisone. Pain is still present.

## 2024-11-03 DIAGNOSIS — R030 Elevated blood-pressure reading, without diagnosis of hypertension: Secondary | ICD-10-CM | POA: Diagnosis not present

## 2024-11-03 DIAGNOSIS — J441 Chronic obstructive pulmonary disease with (acute) exacerbation: Secondary | ICD-10-CM | POA: Diagnosis not present

## 2024-11-06 DIAGNOSIS — J441 Chronic obstructive pulmonary disease with (acute) exacerbation: Secondary | ICD-10-CM | POA: Diagnosis not present

## 2024-11-06 DIAGNOSIS — J069 Acute upper respiratory infection, unspecified: Secondary | ICD-10-CM | POA: Diagnosis not present

## 2024-12-11 ENCOUNTER — Other Ambulatory Visit (HOSPITAL_COMMUNITY): Payer: Self-pay | Admitting: Family Medicine

## 2024-12-11 DIAGNOSIS — F17210 Nicotine dependence, cigarettes, uncomplicated: Secondary | ICD-10-CM

## 2024-12-27 ENCOUNTER — Ambulatory Visit (HOSPITAL_COMMUNITY)
Admission: RE | Admit: 2024-12-27 | Discharge: 2024-12-27 | Disposition: A | Discharge status: Home / Self Care | Source: Ambulatory Visit | Attending: Family Medicine | Admitting: Family Medicine

## 2024-12-27 DIAGNOSIS — F17210 Nicotine dependence, cigarettes, uncomplicated: Secondary | ICD-10-CM | POA: Insufficient documentation

## 2024-12-29 ENCOUNTER — Ambulatory Visit (HOSPITAL_COMMUNITY): Admission: RE | Admit: 2024-12-29 | Source: Ambulatory Visit
